# Patient Record
Sex: Female | Born: 1960 | Race: Black or African American | Hispanic: No | Marital: Married | Smoking: Current every day smoker
Health system: Southern US, Community
[De-identification: ages and names within clinical notes are randomized; demographics above are authoritative.]

## PROBLEM LIST (undated history)

## (undated) DIAGNOSIS — D219 Benign neoplasm of connective and other soft tissue, unspecified: Secondary | ICD-10-CM

## (undated) DIAGNOSIS — K5792 Diverticulitis of intestine, part unspecified, without perforation or abscess without bleeding: Secondary | ICD-10-CM

## (undated) DIAGNOSIS — M21371 Foot drop, right foot: Secondary | ICD-10-CM

## (undated) DIAGNOSIS — E785 Hyperlipidemia, unspecified: Secondary | ICD-10-CM

## (undated) DIAGNOSIS — B373 Candidiasis of vulva and vagina: Secondary | ICD-10-CM

## (undated) DIAGNOSIS — K8020 Calculus of gallbladder without cholecystitis without obstruction: Secondary | ICD-10-CM

## (undated) DIAGNOSIS — I10 Essential (primary) hypertension: Secondary | ICD-10-CM

## (undated) DIAGNOSIS — R011 Cardiac murmur, unspecified: Secondary | ICD-10-CM

## (undated) DIAGNOSIS — I700 Atherosclerosis of aorta: Secondary | ICD-10-CM

## (undated) DIAGNOSIS — A749 Chlamydial infection, unspecified: Secondary | ICD-10-CM

## (undated) DIAGNOSIS — E278 Other specified disorders of adrenal gland: Secondary | ICD-10-CM

## (undated) DIAGNOSIS — B3731 Acute candidiasis of vulva and vagina: Secondary | ICD-10-CM

## (undated) HISTORY — PX: TUBAL LIGATION: SHX77

## (undated) HISTORY — DX: Hyperlipidemia, unspecified: E78.5

## (undated) HISTORY — DX: Chlamydial infection, unspecified: A74.9

## (undated) HISTORY — DX: Essential (primary) hypertension: I10

## (undated) HISTORY — DX: Acute candidiasis of vulva and vagina: B37.31

## (undated) HISTORY — DX: Other specified disorders of adrenal gland: E27.8

## (undated) HISTORY — DX: Benign neoplasm of connective and other soft tissue, unspecified: D21.9

## (undated) HISTORY — DX: Foot drop, right foot: M21.371

## (undated) HISTORY — DX: Cardiac murmur, unspecified: R01.1

## (undated) HISTORY — DX: Atherosclerosis of aorta: I70.0

## (undated) HISTORY — DX: Calculus of gallbladder without cholecystitis without obstruction: K80.20

## (undated) HISTORY — DX: Candidiasis of vulva and vagina: B37.3

---

## 2002-10-31 ENCOUNTER — Encounter: Admission: RE | Admit: 2002-10-31 | Discharge: 2003-01-29 | Payer: Self-pay | Admitting: Family Medicine

## 2003-02-12 ENCOUNTER — Other Ambulatory Visit: Admission: RE | Admit: 2003-02-12 | Discharge: 2003-02-12 | Payer: Self-pay | Admitting: Obstetrics and Gynecology

## 2004-02-20 ENCOUNTER — Other Ambulatory Visit: Admission: RE | Admit: 2004-02-20 | Discharge: 2004-02-20 | Payer: Self-pay | Admitting: *Deleted

## 2004-11-27 ENCOUNTER — Ambulatory Visit (HOSPITAL_COMMUNITY): Admission: RE | Admit: 2004-11-27 | Discharge: 2004-11-27 | Payer: Self-pay | Admitting: Obstetrics and Gynecology

## 2005-05-07 ENCOUNTER — Encounter: Admission: RE | Admit: 2005-05-07 | Discharge: 2005-05-07 | Payer: Self-pay | Admitting: Family Medicine

## 2006-08-11 ENCOUNTER — Other Ambulatory Visit: Admission: RE | Admit: 2006-08-11 | Discharge: 2006-08-11 | Payer: Self-pay | Admitting: Family Medicine

## 2006-08-23 ENCOUNTER — Encounter: Admission: RE | Admit: 2006-08-23 | Discharge: 2006-08-23 | Payer: Self-pay | Admitting: Family Medicine

## 2007-11-07 ENCOUNTER — Other Ambulatory Visit: Admission: RE | Admit: 2007-11-07 | Discharge: 2007-11-07 | Payer: Self-pay | Admitting: Family Medicine

## 2009-01-31 ENCOUNTER — Other Ambulatory Visit: Admission: RE | Admit: 2009-01-31 | Discharge: 2009-01-31 | Payer: Self-pay | Admitting: Family Medicine

## 2009-10-21 ENCOUNTER — Encounter: Admission: RE | Admit: 2009-10-21 | Discharge: 2009-10-21 | Payer: Self-pay | Admitting: Family Medicine

## 2009-10-28 ENCOUNTER — Encounter: Admission: RE | Admit: 2009-10-28 | Discharge: 2009-10-28 | Payer: Self-pay | Admitting: Family Medicine

## 2010-02-17 ENCOUNTER — Other Ambulatory Visit: Admission: RE | Admit: 2010-02-17 | Discharge: 2010-02-17 | Payer: Self-pay | Admitting: Family Medicine

## 2011-07-28 ENCOUNTER — Other Ambulatory Visit (HOSPITAL_COMMUNITY)
Admission: RE | Admit: 2011-07-28 | Discharge: 2011-07-28 | Disposition: A | Discharge status: Home / Self Care | Payer: 59 | Source: Ambulatory Visit | Attending: Family Medicine | Admitting: Family Medicine

## 2011-07-28 ENCOUNTER — Other Ambulatory Visit: Payer: Self-pay | Admitting: Physician Assistant

## 2011-07-28 DIAGNOSIS — Z124 Encounter for screening for malignant neoplasm of cervix: Secondary | ICD-10-CM | POA: Insufficient documentation

## 2012-01-17 ENCOUNTER — Ambulatory Visit (INDEPENDENT_AMBULATORY_CARE_PROVIDER_SITE_OTHER): Payer: 59 | Admitting: Obstetrics and Gynecology

## 2012-01-17 ENCOUNTER — Encounter: Payer: Self-pay | Admitting: Obstetrics and Gynecology

## 2012-01-17 VITALS — BP 130/82 | Resp 16 | Ht 67.0 in | Wt 203.0 lb

## 2012-01-17 DIAGNOSIS — I10 Essential (primary) hypertension: Secondary | ICD-10-CM | POA: Insufficient documentation

## 2012-01-17 DIAGNOSIS — N920 Excessive and frequent menstruation with regular cycle: Secondary | ICD-10-CM | POA: Insufficient documentation

## 2012-01-17 DIAGNOSIS — F17200 Nicotine dependence, unspecified, uncomplicated: Secondary | ICD-10-CM

## 2012-01-17 DIAGNOSIS — E139 Other specified diabetes mellitus without complications: Secondary | ICD-10-CM | POA: Insufficient documentation

## 2012-01-17 DIAGNOSIS — D259 Leiomyoma of uterus, unspecified: Secondary | ICD-10-CM | POA: Insufficient documentation

## 2012-01-17 DIAGNOSIS — E119 Type 2 diabetes mellitus without complications: Secondary | ICD-10-CM

## 2012-01-17 DIAGNOSIS — E7800 Pure hypercholesterolemia, unspecified: Secondary | ICD-10-CM

## 2012-01-17 DIAGNOSIS — E669 Obesity, unspecified: Secondary | ICD-10-CM

## 2012-01-17 DIAGNOSIS — F17210 Nicotine dependence, cigarettes, uncomplicated: Secondary | ICD-10-CM

## 2012-01-17 NOTE — Progress Notes (Signed)
Regular Periods: no Mammogram: yes  Monthly Breast Ex.: yes Exercise: yes  Tetanus < 10 years: no Seatbelts: yes  NI. Bladder Functn.: yes Abuse at home: no  Daily BM's: yes Stressful Work: yes  Healthy Diet: yes Sigmoid-Colonoscopy: NO  Calcium: no Medical problems this year:PT C/O CYCLE PROBLEM   LAST PAP2012 IN JUNE  Contraception: BTL Mammogram: 2011 NL ZOX:WRUEAV REDMON PAC PMH: NO CHANGE FMH: NO CHANGE Last Bone Scan:NEVER

## 2012-01-17 NOTE — Progress Notes (Addendum)
This is the first visit to our office in greater than 3 years.  She complains of heavy menstrual bleeding.  Her last Depo-Provera shot was in January of 2013.  She has had a tubal ligation in the past.  She denies a past history of sexual transmitted infections.  Obstetrical history:  The patient has had 2 full-term vaginal deliveries.  Drug allergies:  None  Past medical history:  The patient has a history of hypertension, diabetes, hypercholesterolemia, and obesity.  Please see her listed medication history.  Social history:  The patient smokes one pack of cigarettes a day for the past 20 years.  Review of systems:  See history of present illness  Family history:  Noncontributory  Physical exam:  Weight: 203 pounds  Height: 5 feet 7 inches  H EENT: Within normal limits  Chest: Clear  Heart: Regular rate and rhythm  Abdomen: Nontender, no masses, no adenopathy  Pelvic exam:  External genitalia: Normal  Vagina: Relaxation noted  Cervix: Nontender  Uterus: 8 week size and irregular, nontender  Adnexa: No masses  Assessment:  Menorrhagia Hypertension Diabetes Obesity Hyperlipidemia Cigarette smoker  Plan:  A long discussion was held about the surgical and medical management of menorrhagia.  This can benefits of both options were outlined.  I recommend that she consider Mirena IUD or endometrial ablation.  The patient was given information to read.  Hydro-sonogram and endometrial biopsy next visit.  Return to office in 2 weeks.  Mylinda Latina.D.

## 2012-02-01 ENCOUNTER — Other Ambulatory Visit: Payer: Self-pay | Admitting: Obstetrics and Gynecology

## 2012-02-01 ENCOUNTER — Encounter: Payer: Self-pay | Admitting: Obstetrics and Gynecology

## 2012-02-01 ENCOUNTER — Ambulatory Visit (INDEPENDENT_AMBULATORY_CARE_PROVIDER_SITE_OTHER): Payer: 59

## 2012-02-01 ENCOUNTER — Ambulatory Visit (INDEPENDENT_AMBULATORY_CARE_PROVIDER_SITE_OTHER): Payer: 59 | Admitting: Obstetrics and Gynecology

## 2012-02-01 VITALS — BP 132/78 | Resp 16 | Ht 67.0 in | Wt 204.0 lb

## 2012-02-01 DIAGNOSIS — E669 Obesity, unspecified: Secondary | ICD-10-CM

## 2012-02-01 DIAGNOSIS — N920 Excessive and frequent menstruation with regular cycle: Secondary | ICD-10-CM

## 2012-02-01 LAB — POCT URINE PREGNANCY: Preg Test, Ur: NEGATIVE

## 2012-02-01 NOTE — Progress Notes (Signed)
Addended by: Janeece Agee on: 02/01/2012 05:59 PM   Modules accepted: Orders

## 2012-02-01 NOTE — Progress Notes (Signed)
Addended by: Tim Lair on: 02/01/2012 12:13 PM   Modules accepted: Orders

## 2012-02-01 NOTE — Progress Notes (Addendum)
[name] is a 51 y.o. year old female,G2P2002, who presents for a problem visit. The patient has a known history of fibroids.  She complains of increasing menorrhagia.  She has diabetes, hypertension, and hypercholesterolemia.  She is obese.  Subjective:  Doing okay.  Objective:  LMP 12/18/2011   General: alert, cooperative and no distress GI: soft, non-tender; bowel sounds normal; no masses,  no organomegaly  External genitalia: normal general appearance Vaginal: normal without tenderness, induration or masses and relaxation noted Cervix: normal appearance Adnexa: normal bimanual exam Uterus: upper limits normal size  Urine pregnancy test: Negative  Procedure:  The vagina was prepped with multiple layers of Betadine.  Hurricaine gel applied to the cervix.  Sounds to 8 cm.  Endometrial biopsy obtained.  Tolerated well.  Catheter inserted.  30 cc of sterile saline injected.  A 3-D ultrasound. No lesions seen in the endometrial cavity.  1.8 cm and 1.6 cm fibroids noted in the subserosal area.  Tolerated well.  Assessment:  Menorrhagia Fibroids  Plan:  Endometrial biopsy to pathology  Management options reviewed.  Risk and benefits discussed.  The patient and her husband elect to proceed with endometrial ablation.  We will schedule in the office.  Return to office in 2-4 week(s).   Leonard Schwartz M.D.  02/01/2012 11:42 AM

## 2012-02-02 NOTE — Progress Notes (Signed)
Addended by: Tim Lair on: 02/02/2012 09:42 AM   Modules accepted: Orders

## 2012-02-04 LAB — PATHOLOGY

## 2012-02-08 ENCOUNTER — Telehealth: Payer: Self-pay | Admitting: Obstetrics and Gynecology

## 2012-02-10 MED ORDER — LORAZEPAM 0.5 MG PO TABS: 0.5000 mg | ORAL_TABLET | ORAL | Status: AC

## 2012-02-10 MED ORDER — HYDROCODONE-ACETAMINOPHEN 7.5-750 MG PO TABS: 1.0000 | ORAL_TABLET | ORAL | Status: AC

## 2012-02-10 NOTE — Progress Notes (Signed)
Addended by: Janine Limbo on: 02/10/2012 12:56 PM   Modules accepted: Orders

## 2012-02-14 ENCOUNTER — Telehealth: Payer: Self-pay

## 2012-02-14 NOTE — Telephone Encounter (Signed)
Note was left on pt chart to confirm if Rx had went through for Ativan 0.5mg  and Vicodin ES 7.5-750mg . Rx had not been called in but were actually printed on 02-10-12. Two Rx were called in today per Vinay @ [facility] on [address]. [facility] CMA

## 2012-02-16 ENCOUNTER — Telehealth: Payer: Self-pay | Admitting: Obstetrics and Gynecology

## 2012-02-16 NOTE — Telephone Encounter (Signed)
In Office Novasure scheduled for 02/24/12 @ 11: 00 with AVS.  [employer] effective 09/13/97.  Plan pays 100% after a $35 copayment Per Shanda Bumps REF# [id number] -Adrianne Pridgen

## 2012-02-24 ENCOUNTER — Ambulatory Visit (INDEPENDENT_AMBULATORY_CARE_PROVIDER_SITE_OTHER): Payer: 59 | Admitting: Obstetrics and Gynecology

## 2012-02-24 ENCOUNTER — Encounter: Payer: Self-pay | Admitting: Obstetrics and Gynecology

## 2012-02-24 VITALS — BP 126/78 | HR 84 | Temp 98.3°F | Resp 16 | Wt 204.0 lb

## 2012-02-24 DIAGNOSIS — N920 Excessive and frequent menstruation with regular cycle: Secondary | ICD-10-CM

## 2012-02-24 LAB — POCT URINE PREGNANCY: Preg Test, Ur: NEGATIVE

## 2012-02-24 MED ORDER — DOXYCYCLINE MONOHYDRATE 100 MG PO CAPS: 100.0000 mg | ORAL_CAPSULE | Freq: Two times a day (BID) | ORAL | Status: AC

## 2012-02-24 MED ORDER — KETOROLAC TROMETHAMINE 60 MG/2ML IM SOLN
60.0000 mg | Freq: Once | INTRAMUSCULAR | Status: AC
  Administered 2012-02-24: 60 mg via INTRAMUSCULAR

## 2012-02-24 NOTE — Progress Notes (Addendum)
HISTORY OF PRESENT ILLNESS  [name] is a 51 y.o. year old female,G2P2002, who presents for a problem visit. She has menorrhagia and was to proceed with endometrial ablation.  Her endometrial biopsy returned benign.  Subjective:  Doing well.  Sleepy from her medications.  Objective:  BP 126/78  Pulse 84  Temp 98.3 F (36.8 C)  Resp 16  Wt 204 lb (92.534 kg)  LMP 01/21/2012   General: sleepy from medications Resp: clear to auscultation bilaterally Cardio: regular rate and rhythm, S1, S2 normal, no murmur, click, rub or gallop GI: soft, non-tender; bowel sounds normal; no masses,  no organomegaly  External genitalia: normal general appearance Vaginal: normal without tenderness, induration or masses and relaxation noted Cervix: normal appearance Adnexa: normal bimanual exam Uterus: upper limits normal size  Procedure:  Hurricaine gel was placed on the cervix.  The cervix was prepped with multiple layers of Betadine.  A paracervical block was placed using 10 cc of half percent Marcaine and 1% lidocaine.  Another 10 cc of half percent Marcaine and 1% lidocaine were injected directly into the cervix. 10 min. Was allowed for the anesthetic to work.  The cervix sounded to 2.5 cm.  The uterus sounded to 9 cm.  The cavity length was noted to be 6.5 cm.  The NovaSure apparatus was tested.  It appeared to work properly.  The NovaSure instrument was placed inside the uterus.  The cavity width was 3 cm.  A test was performed and the instrument was noted to work properly.  The ablation cycle was activated.  The cavity was ablated for 145 seconds.  The patient tolerated her procedure well.  All instrument were removed.  The uterus was reexamined and was noted to be firm.  The patient was returned to the supine position.  She was allowed to rest.  She tolerated her procedure well.  Assessment:  menorrhagia  Plan:  Instructions were given for patients who've undergone NovaSure and  endometrial ablation.  She will call for fevers or chills.  She will call for severe pain.  She will return in 3 weeks for examination.  She can use ibuprofen or Vicodin for pain. She will take doxycycline 100 mg twice each day for 7 days.  Return to office in 3 week(s).   Leonard Schwartz M.D.  02/24/2012 12:09 PM

## 2012-02-24 NOTE — Addendum Note (Signed)
Addended by: Janine Limbo on: 02/24/2012 01:51 PM   Modules accepted: Orders

## 2012-02-24 NOTE — Patient Instructions (Signed)
Endometrial Ablation Endometrial ablation removes the lining of the uterus (endometrium). It is usually a same day, outpatient treatment. Ablation helps avoid major surgery (such as a hysterectomy). A hysterectomy is removal of the cervix and uterus. Endometrial ablation has less risk and complications, has a shorter recovery period and is less expensive. After endometrial ablation, most women will have little or no menstrual bleeding. You may not keep your fertility. Pregnancy is no longer likely after this procedure but if you are pre-menopausal, you still need to use a reliable method of birth control following the procedure because pregnancy can occur. REASONS TO HAVE THE PROCEDURE MAY INCLUDE:  Heavy periods.   Bleeding that is causing anemia.   Anovulatory bleeding, very irregular, bleeding.   Bleeding submucous fibroids (on the lining inside the uterus) if they are smaller than 3 centimeters.  REASONS NOT TO HAVE THE PROCEDURE MAY INCLUDE:  You wish to have more children.   You have a pre-cancerous or cancerous problem. The cause of any abnormal bleeding must be diagnosed before having the procedure.   You have pain coming from the uterus.   You have a submucus fibroid larger than 3 centimeters.   You recently had a baby.   You recently had an infection in the uterus.   You have a severe retro-flexed, tipped uterus and cannot insert the instrument to do the ablation.   You had a Cesarean section or deep major surgery on the uterus.   The inner cavity of the uterus is too large for the endometrial ablation instrument.  RISKS AND COMPLICATIONS   Perforation of the uterus.   Bleeding.   Infection of the uterus, bladder or vagina.   Injury to surrounding organs.   Cutting the cervix.   An air bubble to the lung (air embolus).   Pregnancy following the procedure.   Failure of the procedure to help the problem requiring hysterectomy.   Decreased ability to diagnose  cancer in the lining of the uterus.  BEFORE THE PROCEDURE  The lining of the uterus must be tested to make sure there is no pre-cancerous or cancer cells present.   Medications may be given to make the lining of the uterus thinner.   Ultrasound may be used to evaluate the size and look for abnormalities of the uterus.   Future pregnancy is not desired.  PROCEDURE  There are different ways to destroy the lining of the uterus.   Resectoscope - radio frequency-alternating electric current is the most common one used.   Cryotherapy - freezing the lining of the uterus.   Heated Free Liquid - heated salt (saline) solution inserted into the uterus.   Microwave - uses high energy microwaves in the uterus.   Thermal Balloon - a catheter with a balloon tip is inserted into the uterus and filled with heated fluid.  Your caregiver will talk with you about the method used in this clinic. They will also instruct you on the pros and cons of the procedure. Endometrial ablation is performed along with a procedure called operative hysteroscopy. A narrow viewing tube is inserted through the birth canal (vagina) and through the cervix into the uterus. A tiny camera attached to the viewing tube (hysteroscope) allows the uterine cavity to be shown on a TV monitor during surgery. Your uterus is filled with a harmless liquid to make the procedure easier. The lining of the uterus is then removed. The lining can also be removed with a resectoscope which allows your surgeon   to cut away the lining of the uterus under direct vision. Usually, you will be able to go home within an hour after the procedure. HOME CARE INSTRUCTIONS   Do not drive for 24 hours.   No tampons, douching or intercourse for 2 weeks or until your caregiver approves.   Rest at home for 24 to 48 hours. You may then resume normal activities unless told differently by your caregiver.   Take your temperature two times a day for 4 days, and record  it.   Take any medications your caregiver has ordered, as directed.   Use some form of contraception if you are pre-menopausal and do not want to get pregnant.  Bleeding after the procedure is normal. It varies from light spotting and mildly watery to bloody discharge for 4 to 6 weeks. You may also have mild cramping. Only take over-the-counter or prescription medicines for pain, discomfort, or fever as directed by your caregiver. Do not use aspirin, as this may aggravate bleeding. Frequent urination during the first 24 hours is normal. You will not know how effective your surgery is until at least 3 months after the surgery. SEEK IMMEDIATE MEDICAL CARE IF:   Bleeding is heavier than a normal menstrual cycle.   An oral temperature above 102 F (38.9 C) develops.   You have increasing cramps or pains not relieved with medication or develop belly (abdominal) pain which does not seem to be related to the same area of earlier cramping and pain.   You are light headed, weak or have fainting episodes.   You develop pain in the shoulder strap areas.   You have chest or leg pain.   You have abnormal vaginal discharge.   You have painful urination.  Document Released: 07/09/2004 Document Revised: 08/19/2011 Document Reviewed: 10/07/2007 [employer] Patient Information 2012 [employer]. 

## 2012-03-09 ENCOUNTER — Encounter: Payer: Self-pay | Admitting: Obstetrics and Gynecology

## 2012-03-09 ENCOUNTER — Ambulatory Visit (INDEPENDENT_AMBULATORY_CARE_PROVIDER_SITE_OTHER): Payer: 59 | Admitting: Obstetrics and Gynecology

## 2012-03-09 VITALS — BP 126/80 | Temp 98.5°F | Ht 67.0 in | Wt 203.0 lb

## 2012-03-09 DIAGNOSIS — N920 Excessive and frequent menstruation with regular cycle: Secondary | ICD-10-CM

## 2012-03-09 NOTE — Progress Notes (Signed)
DATE OF SURGERY: 02/24/2012 TYPE OF SURGERY:Novasure Ablation PAIN:No VAG BLEEDING: no/ spotting only. VAG DISCHARGE: no NORMAL GI FUNCTN: yes NORMAL GU FUNCTN: yes  HISTORY OF PRESENT ILLNESS  Nicole Houston is a 51 y.o. year old female,G2P2002, who presents for a problem visit. The patient had a NovaSure ablation of the endometrium on February 24, 2012.  She tolerated her procedure well.  She has a past history of menorrhagia.  Subjective:  The patient reports that she has only a slight discharge at this point.  Objective:  BP 126/80  Temp 98.5 F (36.9 C)  Ht 5\' 7"  (1.702 m)  Wt 203 lb (92.08 kg)  BMI 31.79 kg/m2  LMP 03/08/2012   GI: soft, non-tender; bowel sounds normal; no masses,  no organomegaly  External genitalia: normal general appearance Vaginal: normal without tenderness, induration or masses and relaxation noted Cervix: normal appearance and slight discharge from the cervix Adnexa: normal bimanual exam Uterus: upper limits normal size  Assessment:  Point well status post endometrial ablation for menorrhagia  Plan:  Return to normal activities.  Return to office prn if symptoms worsen or fail to improve.   Leonard Schwartz M.D.  03/09/2012 7:39 PM

## 2014-03-27 ENCOUNTER — Other Ambulatory Visit: Payer: Self-pay | Admitting: Family Medicine

## 2014-03-27 DIAGNOSIS — R1032 Left lower quadrant pain: Secondary | ICD-10-CM

## 2014-03-28 ENCOUNTER — Other Ambulatory Visit: Payer: Self-pay

## 2014-03-28 DIAGNOSIS — Z1231 Encounter for screening mammogram for malignant neoplasm of breast: Secondary | ICD-10-CM

## 2014-04-04 ENCOUNTER — Ambulatory Visit
Admission: RE | Admit: 2014-04-04 | Discharge: 2014-04-04 | Disposition: A | Discharge status: Home / Self Care | Payer: 59 | Source: Ambulatory Visit | Attending: Family Medicine | Admitting: Family Medicine

## 2014-04-04 ENCOUNTER — Ambulatory Visit
Admission: RE | Admit: 2014-04-04 | Discharge: 2014-04-04 | Disposition: A | Discharge status: Home / Self Care | Payer: 59 | Source: Ambulatory Visit

## 2014-04-04 ENCOUNTER — Encounter (INDEPENDENT_AMBULATORY_CARE_PROVIDER_SITE_OTHER): Payer: Self-pay

## 2014-04-04 DIAGNOSIS — R1032 Left lower quadrant pain: Secondary | ICD-10-CM

## 2014-04-04 DIAGNOSIS — Z1231 Encounter for screening mammogram for malignant neoplasm of breast: Secondary | ICD-10-CM

## 2014-04-08 ENCOUNTER — Encounter (INDEPENDENT_AMBULATORY_CARE_PROVIDER_SITE_OTHER): Payer: Self-pay | Admitting: General Surgery

## 2014-04-08 ENCOUNTER — Other Ambulatory Visit: Payer: Self-pay | Admitting: Family Medicine

## 2014-04-08 DIAGNOSIS — R928 Other abnormal and inconclusive findings on diagnostic imaging of breast: Secondary | ICD-10-CM

## 2014-04-11 ENCOUNTER — Ambulatory Visit (INDEPENDENT_AMBULATORY_CARE_PROVIDER_SITE_OTHER): Payer: 59 | Admitting: General Surgery

## 2014-04-15 ENCOUNTER — Ambulatory Visit
Admission: RE | Admit: 2014-04-15 | Discharge: 2014-04-15 | Disposition: A | Discharge status: Home / Self Care | Payer: 59 | Source: Ambulatory Visit | Attending: Family Medicine | Admitting: Family Medicine

## 2014-04-15 DIAGNOSIS — R928 Other abnormal and inconclusive findings on diagnostic imaging of breast: Secondary | ICD-10-CM

## 2014-05-01 ENCOUNTER — Encounter (INDEPENDENT_AMBULATORY_CARE_PROVIDER_SITE_OTHER): Payer: Self-pay | Admitting: General Surgery

## 2014-05-01 ENCOUNTER — Ambulatory Visit (INDEPENDENT_AMBULATORY_CARE_PROVIDER_SITE_OTHER): Payer: 59 | Admitting: General Surgery

## 2014-05-01 VITALS — BP 122/80 | HR 77 | Ht 67.0 in | Wt 200.0 lb

## 2014-05-01 DIAGNOSIS — K8020 Calculus of gallbladder without cholecystitis without obstruction: Secondary | ICD-10-CM

## 2014-05-01 DIAGNOSIS — K838 Other specified diseases of biliary tract: Secondary | ICD-10-CM | POA: Insufficient documentation

## 2014-05-01 NOTE — Patient Instructions (Signed)
I do not believe your left lower quadrant discomfort and pain is due to gallstone disease. I believe it may be more consistent with diverticular problems. You also have a dilated bile duct on your ultrasound. You need to followup with your gastroenterologist to discuss these 2 issues and for additional workup. Please call with any questions  Diverticulosis Diverticulosis is the condition that develops when small pouches (diverticula) form in the wall of your colon. Your colon, or large intestine, is where water is absorbed and stool is formed. The pouches form when the inside layer of your colon pushes through weak spots in the outer layers of your colon. CAUSES  No one knows exactly what causes diverticulosis. RISK FACTORS  Being older than 42. Your risk for this condition increases with age. Diverticulosis is rare in people younger than 40 years. By age 5, almost everyone has it.  Eating a low-fiber diet.  Being frequently constipated.  Being overweight.  Not getting enough exercise.  Smoking.  Taking over-the-counter pain medicines, like aspirin and ibuprofen. SYMPTOMS  Most people with diverticulosis do not have symptoms. DIAGNOSIS  Because diverticulosis often has no symptoms, health care providers often discover the condition during an exam for other colon problems. In many cases, a health care provider will diagnose diverticulosis while using a flexible scope to examine the colon (colonoscopy). TREATMENT  If you have never developed an infection related to diverticulosis, you may not need treatment. If you have had an infection before, treatment may include:  Eating more fruits, vegetables, and grains.  Taking a fiber supplement.  Taking a live bacteria supplement (probiotic).  Taking medicine to relax your colon. HOME CARE INSTRUCTIONS   Drink at least 6-8 glasses of water each day to prevent constipation.  Try not to strain when you have a bowel movement.  Keep all  follow-up appointments. If you have had an infection before:  Increase the fiber in your diet as directed by your health care provider or dietitian.  Take a dietary fiber supplement if your health care provider approves.  Only take medicines as directed by your health care provider. SEEK MEDICAL CARE IF:   You have abdominal pain.  You have bloating.  You have cramps.  You have not gone to the bathroom in 3 days. SEEK IMMEDIATE MEDICAL CARE IF:   Your pain gets worse.  Yourbloating becomes very bad.  You have a fever or chills, and your symptoms suddenly get worse.  You begin vomiting.  You have bowel movements that are bloody or black. MAKE SURE YOU:  Understand these instructions.  Will watch your condition.  Will get help right away if you are not doing well or get worse. Document Released: 05/27/2004 Document Revised: 09/04/2013 Document Reviewed: 07/25/2013 [facility] Patient Information 2015 [place], [place]. This information is not intended to replace advice given to you by your health care provider. Make sure you discuss any questions you have with your health care provider.

## 2014-05-01 NOTE — Progress Notes (Signed)
Patient ID: Nicole Houston, female   DOB: 1961-08-29, 53 y.o.   MRN: [id number]  Chief Complaint  Patient presents with  . eval gallstones    HPI Nicole Houston is a 53 y.o. female.  HPI 53 yo AAF referred by Dr Maurice Small for evaluation of cholelithiasis. Patient states about a month and a half ago she developed stomach pain that was initially constant. It lasted for several days to a week or so. She states that the pain was in her left lower abdomen. She describes the pain as a pressure sensation. While it was initially constant it then became less frequent. She has not had an episode in the past 2-1/2 weeks. She denies any nausea, vomiting, fever or chills. She states that she did feel a little bit bloated when she had the pressure sensation. She rates the discomfort as a 4/10. She reports daily bowel movements. She denies any stool caliber changes. She denies any melena or hematochezia. She denies any reflux. Nothing made the discomfort worse or better. She states that she saw her primary care physician who ordered an abdominal ultrasound and check some blood work. I do not have her lab work for the office visit note. She has never had a colonoscopy. She was scheduled to have one about this time but canceled it when she was told she had a gallstone. Past Medical History  Diagnosis Date  . Diabetes mellitus   . Hypertension   . Hyperlipemia   . Measles   . Yeast vaginitis   . Chlamydia   . Fibroid   . Murmur     Past Surgical History  Procedure Laterality Date  . Tubal ligation      Family History  Problem Relation Age of Onset  . Diabetes Father   . Hypertension Father   . Diabetes Mother   . Hypertension Mother     Social History History  Substance Use Topics  . Smoking status: Current Every Day Smoker -- 1.00 packs/day    Types: Cigarettes  . Smokeless tobacco: Current User     Comment: pt states she smoke a packa day  . Alcohol Use: No    Allergies  Allergen  Reactions  . Altace [Ramipril]   . Tenormin [Atenolol]     Feel bad    Current Outpatient Prescriptions  Medication Sig Dispense Refill  . amLODipine (NORVASC) 10 MG tablet       . glimepiride (AMARYL) 2 MG tablet       . losartan-hydrochlorothiazide (HYZAAR) 100-25 MG per tablet       . metFORMIN (GLUCOPHAGE) 1000 MG tablet        No current facility-administered medications for this visit.    Review of Systems Review of Systems  Constitutional: Negative for fever, activity change, appetite change and unexpected weight change.  HENT: Negative for nosebleeds and trouble swallowing.   Eyes: Negative for photophobia and visual disturbance.  Respiratory: Negative for chest tightness and shortness of breath.   Cardiovascular: Negative for chest pain and leg swelling.       Denies CP, SOB, orthopnea, PND, DOE  Gastrointestinal:       See hpi  Genitourinary: Negative for dysuria and difficulty urinating.  Musculoskeletal: Negative for arthralgias.  Skin: Negative for pallor and rash.  Neurological: Negative for dizziness, seizures, facial asymmetry and numbness.          Hematological: Negative for adenopathy. Does not bruise/bleed easily.  Psychiatric/Behavioral: Negative for behavioral problems and agitation.  Blood pressure 122/80, pulse 77, height 5\' 7"  (1.702 m), weight 200 lb (90.719 kg).  Physical Exam Physical Exam  Constitutional: She is oriented to person, place, and time. She appears well-developed and well-nourished. No distress.  HENT:  Head: Normocephalic and atraumatic.  Right Ear: External ear normal.  Left Ear: External ear normal.  Eyes: Conjunctivae are normal. No scleral icterus.  Neck: Normal range of motion. Neck supple. No tracheal deviation present. No thyromegaly present.  Cardiovascular: Normal rate, normal heart sounds and intact distal pulses.   Pulmonary/Chest: Effort normal and breath sounds normal. No respiratory distress. She has no wheezes.   Abdominal: Soft. She exhibits no distension. There is no tenderness. There is no rebound and no guarding.  Musculoskeletal: Normal range of motion. She exhibits no edema and no tenderness.  Lymphadenopathy:    She has no cervical adenopathy.  Neurological: She is alert and oriented to person, place, and time. She exhibits normal muscle tone.  Skin: Skin is warm and dry. No rash noted. She is not diaphoretic. No erythema. No pallor.  Psychiatric: She has a normal mood and affect. Her behavior is normal. Judgment and thought content normal.    Data Reviewed abd u/s  Assessment    Cholelithiasis Dilated common bile duct LLQ Abdominal pain     Plan    Her symptoms are not consistent with symptomatic gallbladder disease. She does have a gallstone I do not believe it is confusing to her left lower quadrant pain. Fortunately her pain has essentially resolved. My best guess is this might have been a diverticular episode. I do think she needs a screening colonoscopy. I have advised the patient to contact her PCPs office to get rescheduled for the gastroenterologist consult.  I do not have a copy of her lab work. The patient states she was told her liver function tests and pancreas numbers were normal. It's unclear as to what to make of the isolated dilated common bile duct. I do think this needs to be further evaluated by the gastroenterologist. I will defer to them about additional imaging.    We discussed diverticular disease and the patient was given educational material. All of her questions were asked and answered. Followup as needed. We did discuss symptoms attributable to gallbladder disease and what to look out for  Leighton Ruff. Redmond Pulling, MD, FACS General, Bariatric, & Minimally Invasive Surgery [facility]  Note: This dictation was prepared with Dragon/digital dictation along with [facility] technology. Any transcriptional errors that result from this process are  unintentional.        Gayland Curry 05/01/2014, 10:23 AM

## 2014-07-15 ENCOUNTER — Encounter (INDEPENDENT_AMBULATORY_CARE_PROVIDER_SITE_OTHER): Payer: Self-pay | Admitting: General Surgery

## 2014-07-18 ENCOUNTER — Ambulatory Visit
Admission: RE | Admit: 2014-07-18 | Discharge: 2014-07-18 | Disposition: A | Discharge status: Home / Self Care | Payer: 59 | Source: Ambulatory Visit | Attending: Family Medicine | Admitting: Family Medicine

## 2014-07-18 ENCOUNTER — Other Ambulatory Visit: Payer: Self-pay | Admitting: Family Medicine

## 2014-07-18 DIAGNOSIS — R1032 Left lower quadrant pain: Secondary | ICD-10-CM

## 2014-07-18 MED ORDER — IOHEXOL 300 MG/ML  SOLN
100.0000 mL | Freq: Once | INTRAMUSCULAR | Status: AC | PRN
  Administered 2014-07-18: 100 mL via INTRAVENOUS

## 2014-08-13 ENCOUNTER — Other Ambulatory Visit: Payer: Self-pay | Admitting: Gastroenterology

## 2015-05-24 ENCOUNTER — Ambulatory Visit (INDEPENDENT_AMBULATORY_CARE_PROVIDER_SITE_OTHER): Payer: 59 | Admitting: Family Medicine

## 2015-05-24 VITALS — BP 114/72 | HR 59 | Temp 98.1°F | Resp 14 | Ht 67.5 in | Wt 202.4 lb

## 2015-05-24 DIAGNOSIS — K5732 Diverticulitis of large intestine without perforation or abscess without bleeding: Secondary | ICD-10-CM | POA: Diagnosis not present

## 2015-05-24 MED ORDER — AMOXICILLIN-POT CLAVULANATE 875-125 MG PO TABS
1.0000 | ORAL_TABLET | Freq: Two times a day (BID) | ORAL | Status: DC
Start: 2015-05-24 — End: 2017-06-14

## 2015-05-24 NOTE — Patient Instructions (Signed)

## 2015-05-24 NOTE — Progress Notes (Signed)
@[facility] @  This chart was scribed for Robyn Haber, MD by Thea Alken, ED Scribe. This patient was seen in room 1 and the patient's care was started at 10:15 AM.  Patient ID: Nicole Houston MRN: [id number], DOB: April 22, 1961, 54 y.o. Date of Encounter: 05/24/2015, 10:14 AM  Primary Physician: Jonathon Bellows, MD  Chief Complaint:  Chief Complaint  Patient presents with  . Abdominal Pain    History of Diverticulitis    HPI: 54 y.o. year old female with history diverticulitis presents with abdominal pain for 2 weeks. Pt reports abdominal pain worsened this morning with nausea and groin pain. She denies pain radiating to bladder or to LE. She had colonoscopy in 08/2014. Her last flare up of diverticulitis was October 2015 and also had some pain in April but was not seen. She has had CT scan and MRI in the past prior to colonoscopy. No fever, chills and nausea. She denies drug allergies. Pt smokes a pack a day.   Pt works at [employer] in [employer].  Past Medical History  Diagnosis Date  . Diabetes mellitus   . Hypertension   . Hyperlipemia   . Measles   . Yeast vaginitis   . Chlamydia   . Fibroid   . Murmur      Home Meds: Prior to Admission medications   Medication Sig Start Date End Date Taking? Authorizing Provider  amLODipine (NORVASC) 10 MG tablet  03/27/14  Yes Historical Provider, MD  glimepiride (AMARYL) 2 MG tablet  03/27/14  Yes Historical Provider, MD  losartan-hydrochlorothiazide (HYZAAR) 100-25 MG per tablet  03/27/14  Yes Historical Provider, MD  metFORMIN (GLUCOPHAGE) 1000 MG tablet  04/22/14  Yes Historical Provider, MD    Allergies:  Allergies  Allergen Reactions  . Altace [Ramipril]   . Tenormin [Atenolol]     Feel bad    Social History   Social History  . Marital Status: Married    Spouse Name: N/A  . Number of Children: N/A  . Years of Education: N/A   Occupational History  . Not on file.   Social History Main Topics  . Smoking  status: Current Every Day Smoker -- 1.00 packs/day    Types: Cigarettes  . Smokeless tobacco: Current User     Comment: pt states she smoke a packa day  . Alcohol Use: No  . Drug Use: No  . Sexual Activity:    Partners: Male    Birth Control/ Protection: None   Other Topics Concern  . Not on file   Social History Narrative     Review of Systems: Constitutional: negative for chills, fever, night sweats, weight changes, or fatigue  HEENT: negative for vision changes, hearing loss, congestion, rhinorrhea, ST, epistaxis, or sinus pressure Cardiovascular: negative for chest pain or palpitations Respiratory: negative for hemoptysis, wheezing, shortness of breath, or cough Abdominal: negative for vomiting, diarrhea, or constipation Dermatological: negative for rash Neurologic: negative for headache, dizziness, or syncope All other systems reviewed and are otherwise negative with the exception to those above and in the HPI.   Physical Exam: Blood pressure 114/72, pulse 59, temperature 98.1 F (36.7 C), temperature source Oral, resp. rate 14, height 5' 7.5" (1.715 m), weight 202 lb 6.4 oz (91.808 kg), last menstrual period 03/08/2012, SpO2 98 %., Body mass index is 31.21 kg/(m^2). General: Well developed, well nourished, in no acute distress. Head: Normocephalic, atraumatic, eyes without discharge, sclera non-icteric, nares are without discharge. Bilateral auditory canals clear, TM's are without  perforation, pearly grey and translucent with reflective cone of light bilaterally. Oral cavity moist, posterior pharynx without exudate, erythema, peritonsillar abscess, or post nasal drip.  Neck: Supple. No thyromegaly. Full ROM. No lymphadenopathy. Lungs: Clear bilaterally to auscultation without wheezes, rales, or rhonchi. Breathing is unlabored. Heart: RRR with S1 S2. No murmurs, rubs, or gallops appreciated. Abdomen: Soft, non-distended with normoactive bowel sounds. No hepatomegaly. No  guarding. No obvious abdominal masses. Tender LLQ with some rebound Msk:  Strength and tone normal for age. Extremities/Skin: Warm and dry. No clubbing or cyanosis. No edema. No rashes or suspicious lesions. Neuro: Alert and oriented X 3. Moves all extremities spontaneously. Gait is normal. CNII-XII grossly in tact. Psych:  Responds to questions appropriately with a normal affect.    ASSESSMENT AND PLAN:  54 y.o. year old female with acute diverticulitis. We reviewed the consequences of worsening disease and the importance of clear liquids. This chart was scribed in my presence and reviewed by me personally.    ICD-9-CM ICD-10-CM   1. Diverticulitis of colon 562.11 K57.32 amoxicillin-clavulanate (AUGMENTIN) 875-125 MG per tablet      Signed, Robyn Haber, MD 05/24/2015 10:14 AM

## 2015-11-17 ENCOUNTER — Emergency Department (HOSPITAL_BASED_OUTPATIENT_CLINIC_OR_DEPARTMENT_OTHER)
Admission: EM | Admit: 2015-11-17 | Discharge: 2015-11-17 | Disposition: A | Discharge status: Home / Self Care | Payer: 59 | Attending: Emergency Medicine | Admitting: Emergency Medicine

## 2015-11-17 ENCOUNTER — Encounter (HOSPITAL_BASED_OUTPATIENT_CLINIC_OR_DEPARTMENT_OTHER): Payer: Self-pay

## 2015-11-17 ENCOUNTER — Emergency Department (HOSPITAL_BASED_OUTPATIENT_CLINIC_OR_DEPARTMENT_OTHER): Payer: 59

## 2015-11-17 DIAGNOSIS — Z8619 Personal history of other infectious and parasitic diseases: Secondary | ICD-10-CM | POA: Insufficient documentation

## 2015-11-17 DIAGNOSIS — E119 Type 2 diabetes mellitus without complications: Secondary | ICD-10-CM | POA: Insufficient documentation

## 2015-11-17 DIAGNOSIS — Z792 Long term (current) use of antibiotics: Secondary | ICD-10-CM | POA: Diagnosis not present

## 2015-11-17 DIAGNOSIS — R1031 Right lower quadrant pain: Secondary | ICD-10-CM | POA: Diagnosis not present

## 2015-11-17 DIAGNOSIS — F17210 Nicotine dependence, cigarettes, uncomplicated: Secondary | ICD-10-CM | POA: Insufficient documentation

## 2015-11-17 DIAGNOSIS — R011 Cardiac murmur, unspecified: Secondary | ICD-10-CM | POA: Insufficient documentation

## 2015-11-17 DIAGNOSIS — Z8639 Personal history of other endocrine, nutritional and metabolic disease: Secondary | ICD-10-CM | POA: Insufficient documentation

## 2015-11-17 DIAGNOSIS — I10 Essential (primary) hypertension: Secondary | ICD-10-CM | POA: Insufficient documentation

## 2015-11-17 DIAGNOSIS — Z8719 Personal history of other diseases of the digestive system: Secondary | ICD-10-CM | POA: Insufficient documentation

## 2015-11-17 DIAGNOSIS — Z86018 Personal history of other benign neoplasm: Secondary | ICD-10-CM | POA: Diagnosis not present

## 2015-11-17 DIAGNOSIS — Z9851 Tubal ligation status: Secondary | ICD-10-CM | POA: Diagnosis not present

## 2015-11-17 HISTORY — DX: Diverticulitis of intestine, part unspecified, without perforation or abscess without bleeding: K57.92

## 2015-11-17 LAB — COMPREHENSIVE METABOLIC PANEL
ALBUMIN: 4 g/dL (ref 3.5–5.0)
ALK PHOS: 62 U/L (ref 38–126)
ALT: 14 U/L (ref 14–54)
AST: 12 U/L — ABNORMAL LOW (ref 15–41)
Anion gap: 10 (ref 5–15)
BUN: 10 mg/dL (ref 6–20)
CHLORIDE: 99 mmol/L — AB (ref 101–111)
CO2: 25 mmol/L (ref 22–32)
Calcium: 10.4 mg/dL — ABNORMAL HIGH (ref 8.9–10.3)
Creatinine, Ser: 0.65 mg/dL (ref 0.44–1.00)
GFR calc Af Amer: >60 mL/min (ref >60)
Glucose, Bld: 197 mg/dL — ABNORMAL HIGH (ref 65–99)
Potassium: 4.3 mmol/L (ref 3.5–5.1)
SODIUM: 134 mmol/L — AB (ref 135–145)
TOTAL PROTEIN: 7.2 g/dL (ref 6.5–8.1)
Total Bilirubin: 0.3 mg/dL (ref 0.3–1.2)

## 2015-11-17 LAB — CBC WITH DIFFERENTIAL/PLATELET
BASOS PCT: 0 %
Basophils Absolute: 0 10*3/uL (ref 0.0–0.1)
EOS ABS: 0 10*3/uL (ref 0.0–0.7)
EOS PCT: 0 %
HCT: 39.1 % (ref 36.0–46.0)
Hemoglobin: 13.3 g/dL (ref 12.0–15.0)
Lymphocytes Relative: 13 %
Lymphs Abs: 1.8 10*3/uL (ref 0.7–4.0)
MCH: 30.5 pg (ref 26.0–34.0)
MCHC: 34 g/dL (ref 30.0–36.0)
MCV: 89.7 fL (ref 78.0–100.0)
MONO ABS: 0.4 10*3/uL (ref 0.1–1.0)
MONOS PCT: 3 %
Neutro Abs: 12 10*3/uL — ABNORMAL HIGH (ref 1.7–7.7)
Neutrophils Relative %: 84 %
PLATELETS: 416 10*3/uL — AB (ref 150–400)
RBC: 4.36 MIL/uL (ref 3.87–5.11)
RDW: 14.1 % (ref 11.5–15.5)
WBC: 14.2 10*3/uL — ABNORMAL HIGH (ref 4.0–10.5)

## 2015-11-17 LAB — URINE MICROSCOPIC-ADD ON: WBC UA: NONE SEEN WBC/hpf (ref 0–5)

## 2015-11-17 LAB — URINALYSIS, ROUTINE W REFLEX MICROSCOPIC
Bilirubin Urine: NEGATIVE
Glucose, UA: 500 mg/dL — AB
Hgb urine dipstick: NEGATIVE
KETONES UR: 40 mg/dL — AB
LEUKOCYTES UA: NEGATIVE
NITRITE: NEGATIVE
PH: 6 (ref 5.0–8.0)
Protein, ur: 30 mg/dL — AB
Specific Gravity, Urine: 1.028 (ref 1.005–1.030)

## 2015-11-17 LAB — LIPASE, BLOOD: Lipase: 17 U/L (ref 11–51)

## 2015-11-17 MED ORDER — IOHEXOL 300 MG/ML  SOLN
100.0000 mL | Freq: Once | INTRAMUSCULAR | Status: AC | PRN
  Administered 2015-11-17: 100 mL via INTRAVENOUS

## 2015-11-17 MED ORDER — IOHEXOL 300 MG/ML  SOLN
50.0000 mL | Freq: Once | INTRAMUSCULAR | Status: AC | PRN
  Administered 2015-11-17: 50 mL via ORAL

## 2015-11-17 MED ORDER — ONDANSETRON HCL 4 MG/2ML IJ SOLN
4.0000 mg | Freq: Once | INTRAMUSCULAR | Status: AC
  Administered 2015-11-17: 4 mg via INTRAVENOUS
  Filled 2015-11-17: qty 2

## 2015-11-17 MED ORDER — MORPHINE SULFATE (PF) 4 MG/ML IV SOLN
4.0000 mg | Freq: Once | INTRAVENOUS | Status: AC
  Administered 2015-11-17: 4 mg via INTRAVENOUS
  Filled 2015-11-17: qty 1

## 2015-11-17 MED ORDER — HYDROCODONE-ACETAMINOPHEN 5-325 MG PO TABS
1.0000 | ORAL_TABLET | Freq: Four times a day (QID) | ORAL | Status: DC | PRN
Start: 2015-11-17 — End: 2017-06-14

## 2015-11-17 NOTE — ED Notes (Signed)
Pt reports several days of rlq abd pain, no radiation.  One episode of vomiting today.

## 2015-11-17 NOTE — ED Provider Notes (Signed)
CSN: [id number]     Arrival date & time 11/17/15  1701 History   First MD Initiated Contact with Patient 11/17/15 1849     Chief Complaint  Patient presents with  . Abdominal Pain     (Consider location/radiation/quality/duration/timing/severity/associated sxs/prior Treatment) HPI Comments: Patient presents today with a chief complaint of RLQ abdominal pain.  She states that the pain has been present for the past 2 days.  Initially the pain was intermittent, but has been constant since this morning.  She states that she has taken Tylenol for the pain with no relief.  Nothing makes the pain better or worse.  No association with eating.  She reports one episode of vomiting today, but no other symptoms.  No diarrhea, urinary symptoms, fever, chills, constipation, or vaginal discharge.    The history is provided by the patient.    Past Medical History  Diagnosis Date  . Diabetes mellitus   . Hypertension   . Hyperlipemia   . Measles   . Yeast vaginitis   . Chlamydia   . Fibroid   . Murmur   . Diverticulitis    Past Surgical History  Procedure Laterality Date  . Tubal ligation     Family History  Problem Relation Age of Onset  . Diabetes Father   . Hypertension Father   . Diabetes Mother   . Hypertension Mother   . Diabetes Sister   . Stroke Sister    Social History  Substance Use Topics  . Smoking status: Current Every Day Smoker -- 1.00 packs/day    Types: Cigarettes  . Smokeless tobacco: Current User     Comment: pt states she smoke a packa day  . Alcohol Use: No   OB History    Gravida Para Term Preterm AB TAB SAB Ectopic Multiple Living   2 2 2       2      Review of Systems  All other systems reviewed and are negative.     Allergies  Altace and Tenormin  Home Medications   Prior to Admission medications   Medication Sig Start Date End Date Taking? Authorizing Provider  amLODipine (NORVASC) 10 MG tablet  03/27/14   Historical Provider, MD   amoxicillin-clavulanate (AUGMENTIN) 875-125 MG per tablet Take 1 tablet by mouth 2 (two) times daily. 05/24/15   Robyn Haber, MD  glimepiride [facility]) 2 MG tablet  03/27/14   Historical Provider, MD  losartan-hydrochlorothiazide [facility]) 100-25 MG per tablet  03/27/14   Historical Provider, MD  metFORMIN (GLUCOPHAGE) 1000 MG tablet  04/22/14   Historical Provider, MD   BP 146/76 mmHg  Pulse 62  Temp(Src) 98.9 F (37.2 C) (Oral)  Resp 16  Ht 5\' 7"  (1.702 m)  Wt 89.812 kg  BMI 31.00 kg/m2  SpO2 97%  LMP 03/08/2012 Physical Exam  Constitutional: She appears well-developed and well-nourished.  HENT:  Head: Normocephalic and atraumatic.  Mouth/Throat: Oropharynx is clear and moist.  Neck: Normal range of motion. Neck supple.  Cardiovascular: Normal rate, regular rhythm and normal heart sounds.   Pulmonary/Chest: Effort normal and breath sounds normal.  Abdominal: Soft. Bowel sounds are normal. She exhibits no distension and no mass. There is tenderness in the right lower quadrant. There is no rebound, no guarding and negative Murphy's sign.  NO RUQ tenderness to palpation  Musculoskeletal: Normal range of motion.  Neurological: She is alert.  Skin: Skin is warm and dry.  Psychiatric: She has a normal mood and affect.  Nursing note  and vitals reviewed.   ED Course  Procedures (including critical care time) Labs Review Labs Reviewed  URINALYSIS, ROUTINE W REFLEX MICROSCOPIC (NOT AT [facility]) - Abnormal; Notable for the following:    Glucose, UA 500 (*)    Ketones, ur 40 (*)    Protein, ur 30 (*)    All other components within normal limits  URINE MICROSCOPIC-ADD ON - Abnormal; Notable for the following:    Squamous Epithelial / LPF 0-5 (*)    Bacteria, UA RARE (*)    All other components within normal limits  CBC WITH DIFFERENTIAL/PLATELET  COMPREHENSIVE METABOLIC PANEL  LIPASE, BLOOD    Imaging Review No results found. I have personally reviewed and evaluated these images  and lab results as part of my medical decision-making.   EKG Interpretation None      MDM   Final diagnoses:  RLQ abdominal pain   Patient presents today with RLQ abdominal pain.  Labs unremarkable aside from mild leukocytosis.  CT ab/pelvis ordered to rule out an Appendicitis.  CT results as above showing no acute findings. Discussed at length the results of the CT with patient and she is aware of the need for OB/GYN and possible GI follow up.  She reports that she has been informed of these findings in the past.  Pain improved in the ED.  Feel that the patient is stable for discharge.  Return precautions given.    Hyman Bible, PA-C 11/19/15 2149  Leonard Schwartz, MD 11/20/15 [phone]

## 2015-11-17 NOTE — Discharge Instructions (Signed)
The CT scan today showed thickening of the endometrium of the uterus.  It is recommended to follow up with OB/GYN to get testing due to concern for cancer.

## 2015-11-19 ENCOUNTER — Other Ambulatory Visit: Payer: Self-pay | Admitting: Family Medicine

## 2015-11-19 DIAGNOSIS — K838 Other specified diseases of biliary tract: Secondary | ICD-10-CM

## 2015-11-26 ENCOUNTER — Ambulatory Visit
Admission: RE | Admit: 2015-11-26 | Discharge: 2015-11-26 | Disposition: A | Discharge status: Home / Self Care | Payer: 59 | Source: Ambulatory Visit | Attending: Family Medicine | Admitting: Family Medicine

## 2015-11-26 DIAGNOSIS — K838 Other specified diseases of biliary tract: Secondary | ICD-10-CM

## 2015-11-26 MED ORDER — GADOBENATE DIMEGLUMINE 529 MG/ML IV SOLN
18.0000 mL | Freq: Once | INTRAVENOUS | Status: AC | PRN
  Administered 2015-11-26: 18 mL via INTRAVENOUS

## 2015-12-24 ENCOUNTER — Encounter: Payer: 59 | Admitting: Physician Assistant

## 2015-12-24 NOTE — Patient Instructions (Signed)
     IF you received an x-ray today, you will receive an invoice from [facility]. Please contact  Radiology at [phone] with questions or concerns regarding your invoice.   IF you received labwork today, you will receive an invoice from [facility]. Please contact [facility] at [phone] with questions or concerns regarding your invoice.   Our billing staff will not be able to assist you with questions regarding bills from these companies.  You will be contacted with the lab results as soon as they are available. The fastest way to get your results is to activate your My Chart account. Instructions are located on the last page of this paperwork. If you have not heard from us regarding the results in 2 weeks, please contact this office.      

## 2015-12-24 NOTE — Progress Notes (Signed)
This encounter was created in error - please disregard.

## 2015-12-24 NOTE — Progress Notes (Deleted)
   Subjective:    Patient ID: Nicole Houston, female    DOB: 06-29-1961, 55 y.o.   MRN: [id number]  Chief Complaint  Patient presents with  . Possible Flu    Due to husband being sick    HPI    Review of Systems     Objective:   Physical Exam        Assessment & Plan:

## 2016-05-20 ENCOUNTER — Encounter: Payer: 59 | Attending: Family Medicine | Admitting: Dietician

## 2016-05-20 ENCOUNTER — Encounter: Payer: Self-pay | Admitting: Dietician

## 2016-05-20 DIAGNOSIS — E118 Type 2 diabetes mellitus with unspecified complications: Secondary | ICD-10-CM

## 2016-05-20 DIAGNOSIS — Z029 Encounter for administrative examinations, unspecified: Secondary | ICD-10-CM | POA: Diagnosis present

## 2016-05-20 NOTE — Progress Notes (Signed)
Diabetes Self-Management Education  Visit Type: First/Initial  Appt. Start Time: 0925 Appt. End Time: 1030  05/20/2016  [name], identified by name and date of birth, is a 55 y.o. female with a diagnosis of Diabetes: Type 2. She has also been having problems with diverticulitis and is currently on medication for this.  She reports following a vegetarian diet for about 5 years and then 2 years ago resumed intake of meat and decreased her plant food intake/fiber.  Diverticulitis started at that time.  Other hx includes THN, hyperlipidemia and smoking.  Medications include glimepiride and Metformin.  She was taking this just with fruit at times.  Weight hx: Lowest adult weight 193 lbs which is today's weight. Highest adult weight 260 lbs 17 years ago when she was diagnosed with diabetes. UBW 200 lbs.  Current weight is down due to diverticulitis flair.  Patient lives with her husband.  She does the shopping and cooking.  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  ASSESSMENT  Height 5\' 8"  (1.727 m), weight 193 lb (87.5 kg), last menstrual period 03/08/2012. Body mass index is 29.35 kg/m.      Diabetes Self-Management Education - 05/20/16 0930      Visit Information   Visit Type First/Initial     Initial Visit   Diabetes Type Type 2   Are you currently following a meal plan? No   Are you taking your medications as prescribed? Yes   Date Diagnosed 2000     Health Coping   How would you rate your overall health? Good     Psychosocial Assessment   Patient Belief/Attitude about Diabetes Motivated to manage diabetes   Self-care barriers None   Self-management support Doctor's office;Family   Other persons present Patient   Patient Concerns Nutrition/Meal planning   Special Needs None   Preferred Learning Style No preference indicated   Learning Readiness Ready   How often do you need to have someone help you when you read instructions,  pamphlets, or other written materials from your doctor or pharmacy? 1 - Never   What is the last grade level you completed in school? 4 years college     Pre-Education Assessment   Patient understands the diabetes disease and treatment process. Needs Review   Patient understands incorporating nutritional management into lifestyle. Needs Instruction   Patient undertands incorporating physical activity into lifestyle. Needs Instruction   Patient understands using medications safely. Demonstrates understanding / competency   Patient understands monitoring blood glucose, interpreting and using results Needs Review   Patient understands prevention, detection, and treatment of acute complications. Needs Review   Patient understands prevention, detection, and treatment of chronic complications. Needs Review   Patient understands how to develop strategies to address psychosocial issues. Needs Review   Patient understands how to develop strategies to promote health/change behavior. Needs Instruction     Complications   Last HgB A1C per patient/outside source 7.6 %  estimated 01/2016   How often do you check your blood sugar? 1-2 times/day   Fasting Blood glucose range (mg/dL) 70-129   Number of hypoglycemic episodes per month 0   Number of hyperglycemic episodes per week 0   Have you had a dilated eye exam in the past 12 months? Yes   Have you had a dental exam in the past 12 months? No   Are you checking your feet? No     Dietary Intake   Breakfast fruit  Snack (morning) none   Lunch leftovers or salad OR [employer] recently or other fast food   Snack (afternoon) chex mix or cheese its or fig newtons or trail mix   Dinner grilled chicken or occasionally fried, vegetable   Snack (evening) apple with peanut butter or chips or cheese and crackers  7:30   Beverage(s) water, coffee with sugar sub and cream, rare sweet tea     Exercise   Exercise Type Light (walking / raking leaves)   How many days  per week to you exercise? 2   How many minutes per day do you exercise? 30   Total minutes per week of exercise 60     Patient Education   Previous Diabetes Education No   Disease state  Definition of diabetes, type 1 and 2, and the diagnosis of diabetes   Nutrition management  Role of diet in the treatment of diabetes and the relationship between the three main macronutrients and blood glucose level;Food label reading, portion sizes and measuring food.;Carbohydrate counting;Information on hints to eating out and maintain blood glucose control.;Meal options for control of blood glucose level and chronic complications.;Other (comment)  diet and diverticulitis guidelines   Physical activity and exercise  Role of exercise on diabetes management, blood pressure control and cardiac health.   Medications Reviewed patients medication for diabetes, action, purpose, timing of dose and side effects.   Monitoring Identified appropriate SMBG and/or A1C goals.;Yearly dilated eye exam;Daily foot exams;Purpose and frequency of SMBG.   Acute complications Taught treatment of hypoglycemia - the 15 rule.   Chronic complications Relationship between chronic complications and blood glucose control;Assessed and discussed foot care and prevention of foot problems   Psychosocial adjustment Worked with patient to identify barriers to care and solutions;Role of stress on diabetes   Personal strategies to promote health Lifestyle issues that need to be addressed for better diabetes care;Review risk of smoking and offered smoking cessation     Individualized Goals (developed by patient)   Nutrition Follow meal plan discussed;General guidelines for healthy choices and portions discussed   Physical Activity Exercise 5-7 days per week;30 minutes per day   Medications take my medication as prescribed   Monitoring  test my blood glucose as discussed   Reducing Risk stop smoking;examine blood glucose patterns      Post-Education Assessment   Patient understands the diabetes disease and treatment process. Demonstrates understanding / competency   Patient understands incorporating nutritional management into lifestyle. Demonstrates understanding / competency   Patient undertands incorporating physical activity into lifestyle. Demonstrates understanding / competency   Patient understands using medications safely. Demonstrates understanding / competency   Patient understands monitoring blood glucose, interpreting and using results Demonstrates understanding / competency   Patient understands prevention, detection, and treatment of acute complications. Demonstrates understanding / competency   Patient understands prevention, detection, and treatment of chronic complications. Demonstrates understanding / competency   Patient understands how to develop strategies to address psychosocial issues. Demonstrates understanding / competency   Patient understands how to develop strategies to promote health/change behavior. Demonstrates understanding / competency     Outcomes   Expected Outcomes Demonstrated interest in learning. Expect positive outcomes   Future DMSE PRN   Program Status Completed      Individualized Plan for Diabetes Self-Management Training:   Learning Objective:  Patient will have a greater understanding of diabetes self-management. Patient education plan is to attend individual and/or group sessions per assessed needs and concerns.   Plan:  Patient Instructions  Low fiber diet for the next 1-2 weeks then increase your fiber gradually to 25 grams per day. Consider a probiotic. Continue to work to quit smoking. Be as active as possible.  Aim for 30 minutes most days. Aim for 3 Carb Choices per meal (45 grams) +/- 1 either way  Aim for 0-1 Carbs per snack if hungry  Include protein in moderation with your meals and snacks Consider reading food labels for Total Carbohydrate and Fat Grams of  foods Consider checking BG at alternate times per day as directed by MD  Consider taking medication as directed by MD      Expected Outcomes:  Demonstrated interest in learning. Expect positive outcomes  Education material provided: Living Well with Diabetes, Food label handouts, A1C conversion sheet, Meal plan card, My Plate and Snack sheet, breakfast ideas, eating out tips for fast food and dining  If problems or questions, patient to contact team via:  Phone, e-mail  Future DSME appointment: PRN

## 2016-05-20 NOTE — Patient Instructions (Signed)
Low fiber diet for the next 1-2 weeks then increase your fiber gradually to 25 grams per day. Consider a probiotic. Continue to work to quit smoking. Be as active as possible.  Aim for 30 minutes most days. Aim for 3 Carb Choices per meal (45 grams) +/- 1 either way  Aim for 0-1 Carbs per snack if hungry  Include protein in moderation with your meals and snacks Consider reading food labels for Total Carbohydrate and Fat Grams of foods Consider checking BG at alternate times per day as directed by MD  Consider taking medication as directed by MD

## 2016-05-27 ENCOUNTER — Ambulatory Visit
Admission: RE | Admit: 2016-05-27 | Discharge: 2016-05-27 | Disposition: A | Discharge status: Home / Self Care | Payer: 59 | Source: Ambulatory Visit | Attending: Family Medicine | Admitting: Family Medicine

## 2016-05-27 ENCOUNTER — Other Ambulatory Visit: Payer: Self-pay | Admitting: Family Medicine

## 2016-05-27 DIAGNOSIS — K5792 Diverticulitis of intestine, part unspecified, without perforation or abscess without bleeding: Secondary | ICD-10-CM

## 2016-05-27 DIAGNOSIS — R109 Unspecified abdominal pain: Secondary | ICD-10-CM

## 2016-05-27 MED ORDER — IOPAMIDOL (ISOVUE-300) INJECTION 61%
100.0000 mL | Freq: Once | INTRAVENOUS | Status: AC | PRN
  Administered 2016-05-27: 100 mL via INTRAVENOUS

## 2016-05-27 MED ORDER — IOHEXOL 300 MG/ML  SOLN
30.0000 mL | Freq: Once | INTRAMUSCULAR | Status: AC | PRN
  Administered 2016-05-27: 30 mL via ORAL

## 2016-05-31 ENCOUNTER — Other Ambulatory Visit: Payer: Self-pay | Admitting: Physician Assistant

## 2016-05-31 DIAGNOSIS — R1032 Left lower quadrant pain: Secondary | ICD-10-CM

## 2016-05-31 DIAGNOSIS — R9389 Abnormal findings on diagnostic imaging of other specified body structures: Secondary | ICD-10-CM

## 2016-06-04 ENCOUNTER — Ambulatory Visit: Payer: 59 | Admitting: *Deleted

## 2016-06-09 ENCOUNTER — Other Ambulatory Visit: Payer: 59

## 2016-11-07 ENCOUNTER — Emergency Department (HOSPITAL_COMMUNITY): Payer: 59

## 2016-11-07 ENCOUNTER — Encounter (HOSPITAL_COMMUNITY): Payer: Self-pay | Admitting: Emergency Medicine

## 2016-11-07 ENCOUNTER — Emergency Department (HOSPITAL_COMMUNITY)
Admission: EM | Admit: 2016-11-07 | Discharge: 2016-11-07 | Disposition: A | Discharge status: Home / Self Care | Payer: 59 | Attending: Emergency Medicine | Admitting: Emergency Medicine

## 2016-11-07 DIAGNOSIS — E109 Type 1 diabetes mellitus without complications: Secondary | ICD-10-CM | POA: Diagnosis not present

## 2016-11-07 DIAGNOSIS — Z7984 Long term (current) use of oral hypoglycemic drugs: Secondary | ICD-10-CM | POA: Diagnosis not present

## 2016-11-07 DIAGNOSIS — I10 Essential (primary) hypertension: Secondary | ICD-10-CM | POA: Insufficient documentation

## 2016-11-07 DIAGNOSIS — R079 Chest pain, unspecified: Secondary | ICD-10-CM | POA: Diagnosis not present

## 2016-11-07 DIAGNOSIS — F17210 Nicotine dependence, cigarettes, uncomplicated: Secondary | ICD-10-CM | POA: Diagnosis not present

## 2016-11-07 DIAGNOSIS — R0789 Other chest pain: Secondary | ICD-10-CM

## 2016-11-07 LAB — I-STAT TROPONIN, ED
Troponin i, poc: 0 ng/mL (ref 0.00–0.08)
Troponin i, poc: 0 ng/mL (ref 0.00–0.08)

## 2016-11-07 LAB — CBC
HCT: 40 % (ref 36.0–46.0)
Hemoglobin: 13.2 g/dL (ref 12.0–15.0)
MCH: 30.4 pg (ref 26.0–34.0)
MCHC: 33 g/dL (ref 30.0–36.0)
MCV: 92.2 fL (ref 78.0–100.0)
PLATELETS: 341 10*3/uL (ref 150–400)
RBC: 4.34 MIL/uL (ref 3.87–5.11)
RDW: 13.8 % (ref 11.5–15.5)
WBC: 8.6 10*3/uL (ref 4.0–10.5)

## 2016-11-07 LAB — BASIC METABOLIC PANEL
Anion gap: 10 (ref 5–15)
BUN: 10 mg/dL (ref 6–20)
CALCIUM: 11.5 mg/dL — AB (ref 8.9–10.3)
CO2: 25 mmol/L (ref 22–32)
CREATININE: 0.67 mg/dL (ref 0.44–1.00)
Chloride: 104 mmol/L (ref 101–111)
GFR calc Af Amer: >60 mL/min (ref >60)
GFR calc non Af Amer: >60 mL/min (ref >60)
GLUCOSE: 271 mg/dL — AB (ref 65–99)
Potassium: 4.3 mmol/L (ref 3.5–5.1)
Sodium: 139 mmol/L (ref 135–145)

## 2016-11-07 MED ORDER — OMEPRAZOLE 20 MG PO CPDR: 20.0000 mg | DELAYED_RELEASE_CAPSULE | Freq: Two times a day (BID) | ORAL | 0 refills | Status: DC

## 2016-11-07 MED ORDER — RANITIDINE HCL 150 MG PO CAPS: 150.0000 mg | ORAL_CAPSULE | Freq: Every day | ORAL | 0 refills | Status: DC

## 2016-11-07 MED ORDER — GI COCKTAIL ~~LOC~~
30.0000 mL | Freq: Once | ORAL | Status: AC
  Administered 2016-11-07: 30 mL via ORAL
  Filled 2016-11-07: qty 30

## 2016-11-07 NOTE — ED Triage Notes (Signed)
Pt.l stated, I started having some chest tightness and off and on nausea.

## 2016-11-07 NOTE — Discharge Instructions (Signed)
Please follow up with your doctor for further evaluation of your condition. You may benefit from an outpatient cardiac work up for your chest pain.  Take prilosec and zantac 30 minutes before meal to help with your pain.

## 2016-11-07 NOTE — ED Provider Notes (Signed)
[facility] DEPT Provider Note   CSN: [id number] Arrival date & time: 11/07/16  [id number]     History   Chief Complaint Chief Complaint  Patient presents with  . Chest Pain  . Nausea    HPI Nicole Houston is a 56 y.o. female.  HPI   56 year old obese female with history of diabetes, hypertension, hyperlipidemia presenting complaining of chest pain. Patient report yesterday afternoon after she ate lunch at a [employer] she developed pressure sensation to her mid chest that is nonradiating but associated with dizziness, mildly nauseous, and mild shortness of breath. Episode lasting for approximately 45 minutes, improved after she belches. She did took an aspirin and some Tums as well, thinking it might be indigestion. This morning prior to going for breakfast but after she drinks her coffee she once again developed the same midsternal chest discomfort.. Symptom has been ongoing for approximately 2 hours but mostly resolved. She is going through menopause and does have hot flash. Patient is a smoker, strong family history of cardiac disease, has not have a formal cardiac workup or cardiac stress test. Denies history of GERD. Denies any recent recent strenuous activities or heavy lifting. No prior history of PE or DVT, no recent surgery, prolonged bed rest, unilateral leg swelling or calf pain, active cancer or hemoptysis. Denies having abdominal pain or dysuria.  Past Medical History:  Diagnosis Date  . Chlamydia   . Diabetes mellitus   . Diverticulitis   . Fibroid   . Hyperlipemia   . Hypertension   . Measles   . Murmur   . Yeast vaginitis     Patient Active Problem List   Diagnosis Date Noted  . Dilated bile duct 05/01/2014  . Cholelithiasis 05/01/2014  . Hypertension 01/17/2012  . Obesity 01/17/2012  . 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 Hypercholesterolemia 01/17/2012  . Menorrhagia 01/17/2012  . Fibroid uterus 01/17/2012  . Cigarette smoker  01/17/2012    Past Surgical History:  Procedure Laterality Date  . TUBAL LIGATION      OB History    Gravida Para Term Preterm AB Living   2 2 2     2    SAB TAB Ectopic Multiple Live Births                   Home Medications    Prior to Admission medications   Medication Sig Start Date End Date Taking? Authorizing Provider  amLODipine (NORVASC) 10 MG tablet  03/27/14   Historical Provider, MD  amoxicillin-clavulanate (AUGMENTIN) 875-125 MG per tablet Take 1 tablet by mouth 2 (two) times daily. Patient not taking: Reported on 12/24/2015 05/24/15   Robyn Haber, MD  glimepiride Jari Sportsman) 2 MG tablet  03/27/14   Historical Provider, MD  HYDROcodone-acetaminophen (NORCO/VICODIN) 5-325 MG tablet Take 1-2 tablets by mouth every 6 (six) hours as needed. 11/17/15   Hyman Bible, PA-C  losartan-hydrochlorothiazide (HYZAAR) 100-25 MG per tablet  03/27/14   Historical Provider, MD  metFORMIN (GLUCOPHAGE) 1000 MG tablet  04/22/14   Historical Provider, MD    Family History Family History  Problem Relation Age of Onset  . Diabetes Father   . Hypertension Father   . Diabetes Mother   . Hypertension Mother   . Diabetes Sister   . Stroke Sister     Social History Social History  Substance Use Topics  . Smoking status: Current Every Day Smoker    Packs/day: 1.00    Types: Cigarettes  .  Smokeless tobacco: Current User     Comment: pt states she smoke a packa day  . Alcohol use No     Allergies   Altace [ramipril] and Tenormin [atenolol]   Review of Systems Review of Systems  All other systems reviewed and are negative.    Physical Exam Updated Vital Signs BP 155/81 (BP Location: Right Arm)   Pulse 62   Temp 98.3 F (36.8 C) (Oral)   Resp 17   Ht 5\' 7"  (1.702 m)   Wt 88 kg   LMP 03/08/2012   SpO2 96%   BMI 30.38 kg/m   Physical Exam  Constitutional: She is oriented to person, place, and time. She appears well-developed and well-nourished. No distress.  Awake,  alert, nontoxic appearance  HENT:  Head: Atraumatic.  Eyes: Conjunctivae are normal.  Neck: Neck supple. No JVD present.  Cardiovascular: Normal rate and regular rhythm.   Pulmonary/Chest: Effort normal. No respiratory distress. She exhibits no tenderness.  Abdominal: Soft. There is no tenderness. There is no rebound.  Musculoskeletal: She exhibits no edema or tenderness.  ROM appears intact, no obvious focal weakness  Neurological: She is alert and oriented to person, place, and time.  Mental status and motor strength appears intact  Skin: No rash noted.  Psychiatric: She has a normal mood and affect.  Nursing note and vitals reviewed.    ED Treatments / Results  Labs (all labs ordered are listed, but only abnormal results are displayed) Labs Reviewed  BASIC METABOLIC PANEL - Abnormal; Notable for the following:       Result Value   Glucose, Bld 271 (*)    Calcium 11.5 (*)    All other components within normal limits  CBC  I-STAT TROPOININ, ED  I-STAT TROPOININ, ED    EKG  EKG Interpretation  Date/Time:  Sunday November 07 2016 09:34:37 EST Ventricular Rate:  65 PR Interval:  170 QRS Duration: 80 QT Interval:  396 QTC Calculation: 411 R Axis:   46 Text Interpretation:  Normal sinus rhythm Normal ECG No prior for comparison.  No STEMI Confirmed by Sherry Ruffing MD, CHRISTOPHER [phone]) on 11/07/2016 9:41:28 AM       Radiology Dg Chest Port 1 View  Result Date: 11/07/2016 CLINICAL DATA:  Patient reports mid chest pain on and off X 1 week, came back this AM. HX HTN, diabetes EXAM: PORTABLE CHEST 1 VIEW COMPARISON:  None. FINDINGS: The heart size and mediastinal contours are within normal limits. Both lungs are clear. No pleural effusion or pneumothorax. The visualized skeletal structures are unremarkable. IMPRESSION: No active disease. Electronically Signed   By: Lajean Manes M.D.   On: 11/07/2016 10:32    Procedures Procedures (including critical care  time)  Medications Ordered in ED Medications  gi cocktail (Maalox,Lidocaine,Donnatal) (30 mLs Oral Given 11/07/16 1014)     Initial Impression / Assessment and Plan / ED Course  I have reviewed the triage vital signs and the nursing notes.  Pertinent labs & imaging results that were available during my care of the patient were reviewed by me and considered in my medical decision making (see chart for details).     BP 153/80   Pulse (!) 56   Temp 98.3 F (36.8 C) (Oral)   Resp 14   Ht 5\' 7"  (1.702 m)   Wt 88 kg   LMP 03/08/2012   SpO2 90%   BMI 30.38 kg/m    Final Clinical Impressions(s) / ED Diagnoses   Final diagnoses:  Atypical chest pain    New Prescriptions New Prescriptions   OMEPRAZOLE (PRILOSEC) 20 MG CAPSULE    Take 1 capsule (20 mg total) by mouth 2 (two) times daily before a meal.   RANITIDINE (ZANTAC) 150 MG CAPSULE    Take 1 capsule (150 mg total) by mouth daily.   9:59 AM Patient here with substernal chest discomfort that started yesterday and today shortly after eating. Her symptoms mostly resolved. She is well-appearing, vital signs stable. Moderate hypertensive but did not take her blood pressure medication this morning.  1:24 PM HEART score of 3, low risk of MACE.  PERC negative, doubt PE.  Negative delta troponin.  Pt is sxs free.  Report moderate improvement after GI cocktail.  Pt will f/u with PCP and likely outpt cardiology for outpt cardiac stress test.  Hx of diabetes, does have CBG 271 today.  She will need to monitor that closely.  prilosec and zantac prescribed for suspect GERD/gastritis causing pain.  Return precaution discussed.    Domenic Moras, PA-C 11/07/16 [place], MD 11/07/16 2232

## 2016-11-10 DIAGNOSIS — E1165 Type 2 diabetes mellitus with hyperglycemia: Secondary | ICD-10-CM | POA: Diagnosis not present

## 2016-11-10 DIAGNOSIS — E119 Type 2 diabetes mellitus without complications: Secondary | ICD-10-CM | POA: Diagnosis not present

## 2016-11-10 DIAGNOSIS — Z780 Asymptomatic menopausal state: Secondary | ICD-10-CM | POA: Diagnosis not present

## 2016-11-16 DIAGNOSIS — E119 Type 2 diabetes mellitus without complications: Secondary | ICD-10-CM | POA: Diagnosis not present

## 2016-11-22 ENCOUNTER — Telehealth: Payer: Self-pay

## 2016-11-22 NOTE — Telephone Encounter (Signed)
Sent note to scheduling

## 2016-12-07 ENCOUNTER — Ambulatory Visit: Payer: 59 | Admitting: Cardiology

## 2017-01-04 DIAGNOSIS — Z7984 Long term (current) use of oral hypoglycemic drugs: Secondary | ICD-10-CM | POA: Diagnosis not present

## 2017-01-04 DIAGNOSIS — Z0000 Encounter for general adult medical examination without abnormal findings: Secondary | ICD-10-CM | POA: Diagnosis not present

## 2017-01-04 DIAGNOSIS — E785 Hyperlipidemia, unspecified: Secondary | ICD-10-CM | POA: Diagnosis not present

## 2017-01-04 DIAGNOSIS — E119 Type 2 diabetes mellitus without complications: Secondary | ICD-10-CM | POA: Diagnosis not present

## 2017-01-19 DIAGNOSIS — E119 Type 2 diabetes mellitus without complications: Secondary | ICD-10-CM | POA: Diagnosis not present

## 2017-02-02 DIAGNOSIS — J209 Acute bronchitis, unspecified: Secondary | ICD-10-CM | POA: Diagnosis not present

## 2017-02-22 DIAGNOSIS — Z01411 Encounter for gynecological examination (general) (routine) with abnormal findings: Secondary | ICD-10-CM | POA: Diagnosis not present

## 2017-02-22 DIAGNOSIS — Z1231 Encounter for screening mammogram for malignant neoplasm of breast: Secondary | ICD-10-CM | POA: Diagnosis not present

## 2017-03-07 DIAGNOSIS — I10 Essential (primary) hypertension: Secondary | ICD-10-CM | POA: Diagnosis not present

## 2017-03-07 DIAGNOSIS — E785 Hyperlipidemia, unspecified: Secondary | ICD-10-CM | POA: Diagnosis not present

## 2017-03-07 DIAGNOSIS — E119 Type 2 diabetes mellitus without complications: Secondary | ICD-10-CM | POA: Diagnosis not present

## 2017-04-04 DIAGNOSIS — G589 Mononeuropathy, unspecified: Secondary | ICD-10-CM | POA: Diagnosis not present

## 2017-04-06 DIAGNOSIS — M542 Cervicalgia: Secondary | ICD-10-CM | POA: Diagnosis not present

## 2017-04-06 DIAGNOSIS — M5030 Other cervical disc degeneration, unspecified cervical region: Secondary | ICD-10-CM | POA: Diagnosis not present

## 2017-04-14 ENCOUNTER — Other Ambulatory Visit: Payer: Self-pay | Admitting: Orthopedic Surgery

## 2017-04-14 DIAGNOSIS — M542 Cervicalgia: Secondary | ICD-10-CM

## 2017-04-25 ENCOUNTER — Ambulatory Visit
Admission: RE | Admit: 2017-04-25 | Discharge: 2017-04-25 | Disposition: A | Discharge status: Home / Self Care | Payer: 59 | Source: Ambulatory Visit | Attending: Orthopedic Surgery | Admitting: Orthopedic Surgery

## 2017-04-25 DIAGNOSIS — M47812 Spondylosis without myelopathy or radiculopathy, cervical region: Secondary | ICD-10-CM | POA: Diagnosis not present

## 2017-04-25 DIAGNOSIS — M542 Cervicalgia: Secondary | ICD-10-CM

## 2017-04-25 MED ORDER — TRIAMCINOLONE ACETONIDE 40 MG/ML IJ SUSP (RADIOLOGY)
60.0000 mg | Freq: Once | INTRAMUSCULAR | Status: AC
  Administered 2017-04-25: 60 mg via EPIDURAL

## 2017-04-25 MED ORDER — IOPAMIDOL (ISOVUE-M 300) INJECTION 61%
1.0000 mL | Freq: Once | INTRAMUSCULAR | Status: AC | PRN
  Administered 2017-04-25: 1 mL via EPIDURAL

## 2017-04-25 NOTE — Discharge Instructions (Signed)

## 2017-04-28 DIAGNOSIS — M5412 Radiculopathy, cervical region: Secondary | ICD-10-CM | POA: Diagnosis not present

## 2017-05-05 DIAGNOSIS — M5083 Other cervical disc disorders, cervicothoracic region: Secondary | ICD-10-CM | POA: Diagnosis not present

## 2017-05-05 DIAGNOSIS — M542 Cervicalgia: Secondary | ICD-10-CM | POA: Diagnosis not present

## 2017-05-09 DIAGNOSIS — M5083 Other cervical disc disorders, cervicothoracic region: Secondary | ICD-10-CM | POA: Diagnosis not present

## 2017-05-09 DIAGNOSIS — M542 Cervicalgia: Secondary | ICD-10-CM | POA: Diagnosis not present

## 2017-05-11 DIAGNOSIS — M542 Cervicalgia: Secondary | ICD-10-CM | POA: Diagnosis not present

## 2017-05-11 DIAGNOSIS — M5083 Other cervical disc disorders, cervicothoracic region: Secondary | ICD-10-CM | POA: Diagnosis not present

## 2017-05-18 DIAGNOSIS — M5083 Other cervical disc disorders, cervicothoracic region: Secondary | ICD-10-CM | POA: Diagnosis not present

## 2017-05-18 DIAGNOSIS — M542 Cervicalgia: Secondary | ICD-10-CM | POA: Diagnosis not present

## 2017-05-20 DIAGNOSIS — M5083 Other cervical disc disorders, cervicothoracic region: Secondary | ICD-10-CM | POA: Diagnosis not present

## 2017-05-20 DIAGNOSIS — M542 Cervicalgia: Secondary | ICD-10-CM | POA: Diagnosis not present

## 2017-05-24 DIAGNOSIS — M542 Cervicalgia: Secondary | ICD-10-CM | POA: Diagnosis not present

## 2017-05-24 DIAGNOSIS — M5083 Other cervical disc disorders, cervicothoracic region: Secondary | ICD-10-CM | POA: Diagnosis not present

## 2017-06-01 DIAGNOSIS — M5083 Other cervical disc disorders, cervicothoracic region: Secondary | ICD-10-CM | POA: Diagnosis not present

## 2017-06-02 ENCOUNTER — Other Ambulatory Visit: Payer: Self-pay | Admitting: Orthopedic Surgery

## 2017-06-02 DIAGNOSIS — M542 Cervicalgia: Secondary | ICD-10-CM

## 2017-06-14 ENCOUNTER — Ambulatory Visit
Admission: RE | Admit: 2017-06-14 | Discharge: 2017-06-14 | Disposition: A | Discharge status: Home / Self Care | Payer: 59 | Source: Ambulatory Visit | Attending: Orthopedic Surgery | Admitting: Orthopedic Surgery

## 2017-06-14 DIAGNOSIS — M542 Cervicalgia: Secondary | ICD-10-CM

## 2017-06-14 DIAGNOSIS — R102 Pelvic and perineal pain: Secondary | ICD-10-CM | POA: Insufficient documentation

## 2017-06-14 DIAGNOSIS — M47812 Spondylosis without myelopathy or radiculopathy, cervical region: Secondary | ICD-10-CM | POA: Diagnosis not present

## 2017-06-14 DIAGNOSIS — B373 Candidiasis of vulva and vagina: Secondary | ICD-10-CM | POA: Insufficient documentation

## 2017-06-14 DIAGNOSIS — R109 Unspecified abdominal pain: Secondary | ICD-10-CM | POA: Insufficient documentation

## 2017-06-14 DIAGNOSIS — B3731 Acute candidiasis of vulva and vagina: Secondary | ICD-10-CM | POA: Insufficient documentation

## 2017-06-14 DIAGNOSIS — N83209 Unspecified ovarian cyst, unspecified side: Secondary | ICD-10-CM | POA: Insufficient documentation

## 2017-06-14 MED ORDER — IOPAMIDOL (ISOVUE-M 300) INJECTION 61%: 1.0000 mL | Freq: Once | INTRAMUSCULAR | Status: DC | PRN

## 2017-06-14 MED ORDER — TRIAMCINOLONE ACETONIDE 40 MG/ML IJ SUSP (RADIOLOGY): 60.0000 mg | Freq: Once | INTRAMUSCULAR | Status: DC

## 2017-06-14 NOTE — Discharge Instructions (Signed)

## 2017-06-28 DIAGNOSIS — J209 Acute bronchitis, unspecified: Secondary | ICD-10-CM | POA: Diagnosis not present

## 2017-09-15 DIAGNOSIS — E119 Type 2 diabetes mellitus without complications: Secondary | ICD-10-CM | POA: Diagnosis not present

## 2017-10-25 DIAGNOSIS — E611 Iron deficiency: Secondary | ICD-10-CM | POA: Diagnosis not present

## 2017-10-25 DIAGNOSIS — R5382 Chronic fatigue, unspecified: Secondary | ICD-10-CM | POA: Diagnosis not present

## 2017-10-25 DIAGNOSIS — E538 Deficiency of other specified B group vitamins: Secondary | ICD-10-CM | POA: Diagnosis not present

## 2017-10-25 DIAGNOSIS — I10 Essential (primary) hypertension: Secondary | ICD-10-CM | POA: Diagnosis not present

## 2017-10-25 DIAGNOSIS — E559 Vitamin D deficiency, unspecified: Secondary | ICD-10-CM | POA: Diagnosis not present

## 2017-12-19 DIAGNOSIS — M79605 Pain in left leg: Secondary | ICD-10-CM | POA: Diagnosis not present

## 2017-12-19 DIAGNOSIS — I868 Varicose veins of other specified sites: Secondary | ICD-10-CM | POA: Diagnosis not present

## 2017-12-19 DIAGNOSIS — M25562 Pain in left knee: Secondary | ICD-10-CM | POA: Diagnosis not present

## 2017-12-21 ENCOUNTER — Other Ambulatory Visit: Payer: Self-pay | Admitting: Family Medicine

## 2017-12-21 DIAGNOSIS — M79605 Pain in left leg: Secondary | ICD-10-CM

## 2017-12-26 ENCOUNTER — Other Ambulatory Visit: Payer: 59

## 2017-12-29 DIAGNOSIS — I87393 Chronic venous hypertension (idiopathic) with other complications of bilateral lower extremity: Secondary | ICD-10-CM | POA: Diagnosis not present

## 2017-12-29 DIAGNOSIS — I83813 Varicose veins of bilateral lower extremities with pain: Secondary | ICD-10-CM | POA: Diagnosis not present

## 2018-01-03 DIAGNOSIS — I83813 Varicose veins of bilateral lower extremities with pain: Secondary | ICD-10-CM | POA: Diagnosis not present

## 2018-01-04 DIAGNOSIS — E785 Hyperlipidemia, unspecified: Secondary | ICD-10-CM | POA: Diagnosis not present

## 2018-01-04 DIAGNOSIS — Z0000 Encounter for general adult medical examination without abnormal findings: Secondary | ICD-10-CM | POA: Diagnosis not present

## 2018-01-04 DIAGNOSIS — E119 Type 2 diabetes mellitus without complications: Secondary | ICD-10-CM | POA: Diagnosis not present

## 2018-01-04 DIAGNOSIS — I10 Essential (primary) hypertension: Secondary | ICD-10-CM | POA: Diagnosis not present

## 2018-01-26 DIAGNOSIS — I83812 Varicose veins of left lower extremities with pain: Secondary | ICD-10-CM | POA: Diagnosis not present

## 2018-01-26 DIAGNOSIS — I83892 Varicose veins of left lower extremities with other complications: Secondary | ICD-10-CM | POA: Diagnosis not present

## 2018-01-26 DIAGNOSIS — I8312 Varicose veins of left lower extremity with inflammation: Secondary | ICD-10-CM | POA: Diagnosis not present

## 2018-02-15 DIAGNOSIS — I83892 Varicose veins of left lower extremities with other complications: Secondary | ICD-10-CM | POA: Diagnosis not present

## 2018-02-15 DIAGNOSIS — I83812 Varicose veins of left lower extremities with pain: Secondary | ICD-10-CM | POA: Diagnosis not present

## 2018-02-15 DIAGNOSIS — I8312 Varicose veins of left lower extremity with inflammation: Secondary | ICD-10-CM | POA: Diagnosis not present

## 2018-02-17 DIAGNOSIS — I83892 Varicose veins of left lower extremities with other complications: Secondary | ICD-10-CM | POA: Diagnosis not present

## 2018-02-17 DIAGNOSIS — I83812 Varicose veins of left lower extremities with pain: Secondary | ICD-10-CM | POA: Diagnosis not present

## 2018-02-17 DIAGNOSIS — I8312 Varicose veins of left lower extremity with inflammation: Secondary | ICD-10-CM | POA: Diagnosis not present

## 2018-02-23 IMAGING — XA DG INJECT/[PERSON_NAME] INC NEEDLE/CATH/PLC EPI/CERV/THOR W/IMG
2 series · 2 of 2 positions shown · non-contrast
Comparison: none

CLINICAL DATA: Spondylosis without myelopathy. Right arm pain and
numbness.

[Series 1: ortho adipose · 1 of 1 slices shown (1 of 2)]
[im 1/1]
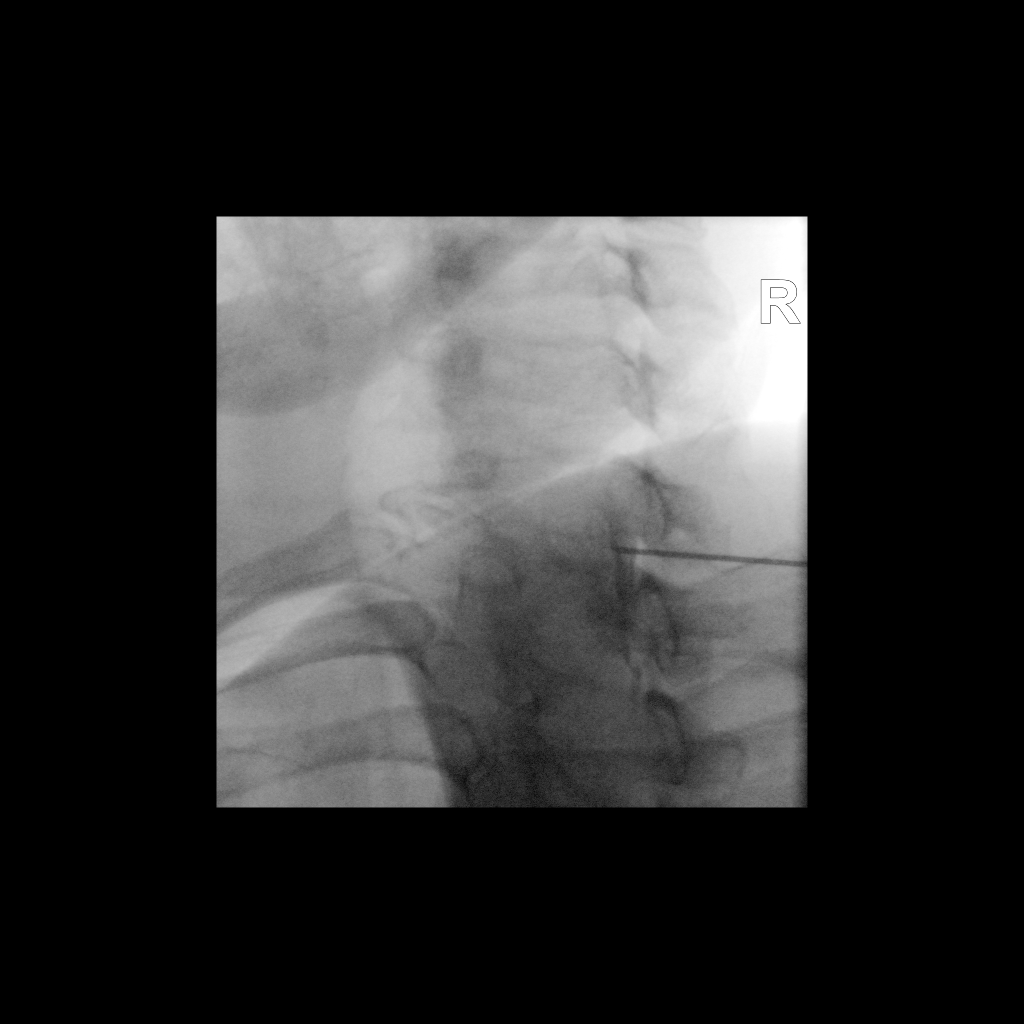

[Series 2: ortho adipose · 1 of 1 slices shown (2 of 2)]
[im 1/1]
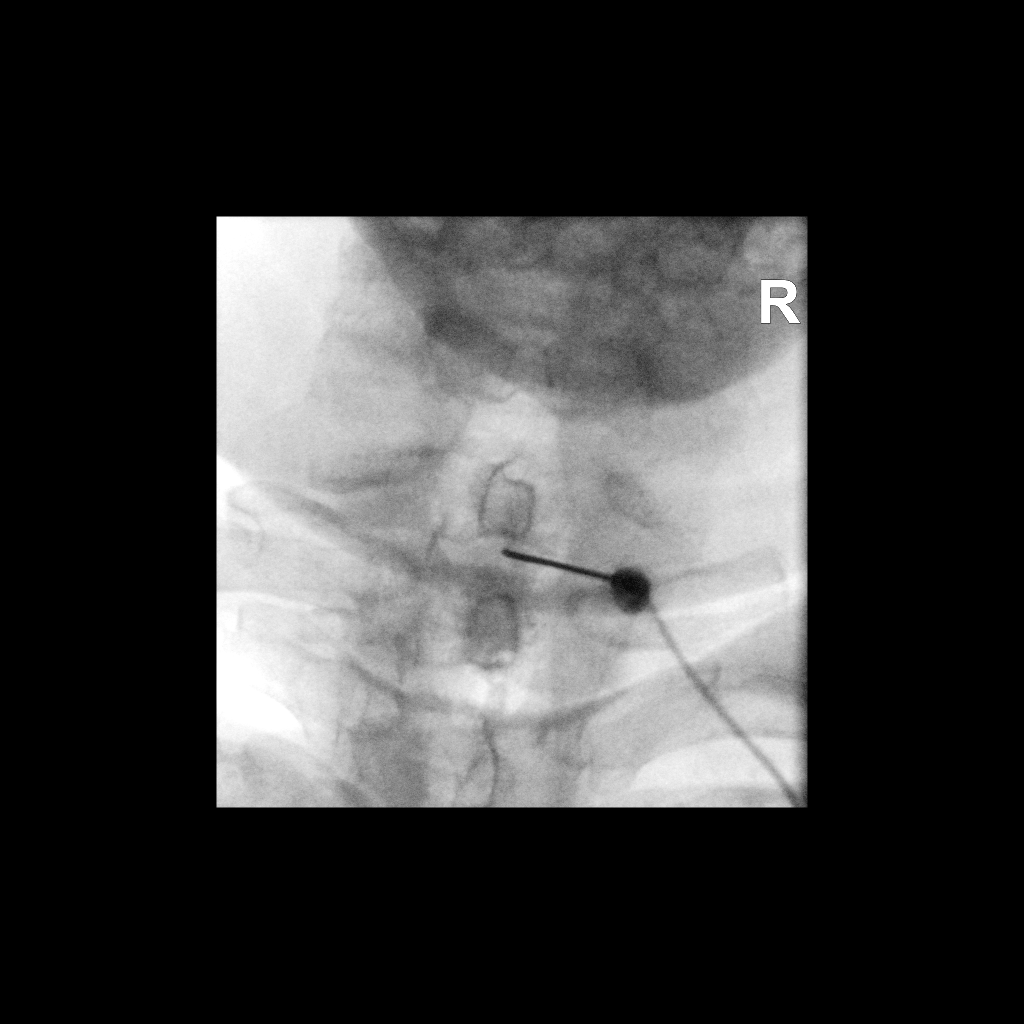

[2 of 2 positions shown; findings below may reference images not displayed]

FLUOROSCOPY TIME:  0 minutes 39 seconds. 34.39 micro gray meter
squared

PROCEDURE:
CERVICAL EPIDURAL INJECTION

An interlaminar approach was performed on the right at C7-T1 . A 20
gauge epidural needle was advanced using loss-of-resistance
technique.

DIAGNOSTIC EPIDURAL INJECTION

Injection of Isovue-M 300 shows a good epidural pattern with spread
above and below the level of needle placement, primarily on the
right. No vascular opacification is seen. THERAPEUTIC

EPIDURAL INJECTION

1.5 ml of Kenalog 40 mixed with 1 ml of 1% Lidocaine and 2 ml of
normal saline were then instilled. The procedure was well-tolerated,
and the patient was discharged thirty minutes following the
injection in good condition.
IMPRESSION: Technically successful initial epidural injection on the right at
C7-T1.

## 2018-02-28 DIAGNOSIS — I8312 Varicose veins of left lower extremity with inflammation: Secondary | ICD-10-CM | POA: Diagnosis not present

## 2018-02-28 DIAGNOSIS — I83812 Varicose veins of left lower extremities with pain: Secondary | ICD-10-CM | POA: Diagnosis not present

## 2018-04-10 DIAGNOSIS — E119 Type 2 diabetes mellitus without complications: Secondary | ICD-10-CM | POA: Diagnosis not present

## 2018-05-08 DIAGNOSIS — I8312 Varicose veins of left lower extremity with inflammation: Secondary | ICD-10-CM | POA: Diagnosis not present

## 2018-05-08 DIAGNOSIS — I83812 Varicose veins of left lower extremities with pain: Secondary | ICD-10-CM | POA: Diagnosis not present

## 2018-05-09 DIAGNOSIS — Z124 Encounter for screening for malignant neoplasm of cervix: Secondary | ICD-10-CM | POA: Diagnosis not present

## 2018-05-09 DIAGNOSIS — Z01411 Encounter for gynecological examination (general) (routine) with abnormal findings: Secondary | ICD-10-CM | POA: Diagnosis not present

## 2018-05-09 DIAGNOSIS — Z6832 Body mass index (BMI) 32.0-32.9, adult: Secondary | ICD-10-CM | POA: Diagnosis not present

## 2018-05-09 DIAGNOSIS — R399 Unspecified symptoms and signs involving the genitourinary system: Secondary | ICD-10-CM | POA: Diagnosis not present

## 2018-06-08 DIAGNOSIS — Z09 Encounter for follow-up examination after completed treatment for conditions other than malignant neoplasm: Secondary | ICD-10-CM | POA: Diagnosis not present

## 2018-06-08 DIAGNOSIS — N951 Menopausal and female climacteric states: Secondary | ICD-10-CM | POA: Diagnosis not present

## 2018-06-09 DIAGNOSIS — I8312 Varicose veins of left lower extremity with inflammation: Secondary | ICD-10-CM | POA: Diagnosis not present

## 2018-07-07 DIAGNOSIS — E785 Hyperlipidemia, unspecified: Secondary | ICD-10-CM | POA: Diagnosis not present

## 2018-07-07 DIAGNOSIS — E119 Type 2 diabetes mellitus without complications: Secondary | ICD-10-CM | POA: Diagnosis not present

## 2018-07-07 DIAGNOSIS — I10 Essential (primary) hypertension: Secondary | ICD-10-CM | POA: Diagnosis not present

## 2018-07-07 DIAGNOSIS — E559 Vitamin D deficiency, unspecified: Secondary | ICD-10-CM | POA: Diagnosis not present

## 2018-10-23 DIAGNOSIS — E119 Type 2 diabetes mellitus without complications: Secondary | ICD-10-CM | POA: Diagnosis not present

## 2018-10-23 DIAGNOSIS — E279 Disorder of adrenal gland, unspecified: Secondary | ICD-10-CM | POA: Diagnosis not present

## 2018-11-03 DIAGNOSIS — E119 Type 2 diabetes mellitus without complications: Secondary | ICD-10-CM | POA: Diagnosis not present

## 2018-11-07 DIAGNOSIS — E119 Type 2 diabetes mellitus without complications: Secondary | ICD-10-CM | POA: Diagnosis not present

## 2018-11-07 DIAGNOSIS — E279 Disorder of adrenal gland, unspecified: Secondary | ICD-10-CM | POA: Diagnosis not present

## 2019-01-29 DIAGNOSIS — E119 Type 2 diabetes mellitus without complications: Secondary | ICD-10-CM | POA: Diagnosis not present

## 2019-01-29 DIAGNOSIS — E279 Disorder of adrenal gland, unspecified: Secondary | ICD-10-CM | POA: Diagnosis not present

## 2019-01-29 DIAGNOSIS — E559 Vitamin D deficiency, unspecified: Secondary | ICD-10-CM | POA: Diagnosis not present

## 2020-01-11 ENCOUNTER — Other Ambulatory Visit: Payer: Self-pay | Admitting: Family Medicine

## 2020-03-19 ENCOUNTER — Other Ambulatory Visit: Payer: Self-pay | Admitting: Family Medicine

## 2020-03-19 DIAGNOSIS — N644 Mastodynia: Secondary | ICD-10-CM

## 2020-08-21 ENCOUNTER — Other Ambulatory Visit: Payer: Self-pay | Admitting: Family Medicine

## 2020-08-21 DIAGNOSIS — M21371 Foot drop, right foot: Secondary | ICD-10-CM

## 2020-08-26 ENCOUNTER — Other Ambulatory Visit: Payer: Self-pay

## 2020-08-26 ENCOUNTER — Ambulatory Visit
Admission: RE | Admit: 2020-08-26 | Discharge: 2020-08-26 | Disposition: A | Discharge status: Home / Self Care | Payer: 59 | Source: Ambulatory Visit | Attending: Family Medicine | Admitting: Family Medicine

## 2020-08-26 DIAGNOSIS — M21371 Foot drop, right foot: Secondary | ICD-10-CM

## 2020-08-27 ENCOUNTER — Encounter: Payer: Self-pay | Admitting: *Deleted

## 2020-08-29 ENCOUNTER — Ambulatory Visit
Admission: RE | Admit: 2020-08-29 | Discharge: 2020-08-29 | Disposition: A | Discharge status: Home / Self Care | Payer: 59 | Source: Ambulatory Visit | Attending: Family Medicine | Admitting: Family Medicine

## 2020-08-29 DIAGNOSIS — M21371 Foot drop, right foot: Secondary | ICD-10-CM

## 2020-09-02 ENCOUNTER — Encounter: Payer: Self-pay | Admitting: Diagnostic Neuroimaging

## 2020-09-02 ENCOUNTER — Ambulatory Visit: Payer: 59 | Admitting: Diagnostic Neuroimaging

## 2020-09-02 VITALS — BP 158/92 | HR 60 | Ht 67.0 in | Wt 174.0 lb

## 2020-09-02 DIAGNOSIS — M21371 Foot drop, right foot: Secondary | ICD-10-CM

## 2020-09-02 NOTE — Progress Notes (Signed)
[facility]  PATIENT: Nicole Houston DOB: 09-Oct-1960  REFERRING CLINICIAN: Maurice Small, MD HISTORY FROM: patient  REASON FOR VISIT: new consult    HISTORICAL  CHIEF COMPLAINT:  Chief Complaint  Patient presents with   New Patient (Initial Visit)    Foot drop; since November 15th. Happened out of the blue, no known injuries. She had her Moderna booster on November 5th and she states it started soon after. That's the only change she can think of.    Referral    Maurice Small, MD   Room 6    Here alone    HISTORY OF PRESENT ILLNESS:   59 year old female here for evaluation of right foot drop.  Symptoms started around November 10-15, 2021, gradual onset and progressive right lower extremity weakness.  She notices mainly problems with her right foot and toe movement pulling them up.  Patient had Moderna Covid vaccine booster on November 5.  She has had 15 pound weight loss over the past 1 year.  Patient does have habitual leg crossing of right over left leg.  Last A1c was 7.3.  Patient went to PCP and endocrinology for evaluation.  She had MRI of the lumbar spine which showed multilevel degenerative changes, foraminal and spinal stenosis.   REVIEW OF SYSTEMS: Full 14 system review of systems performed and negative with exception of: as per HPI.  ALLERGIES: Allergies  Allergen Reactions   Metronidazole Other (See Comments)   Other Other (See Comments)    Crestor, cipro-stomach pain   Valsartan-Hydrochlorothiazide Other (See Comments)    dizzy   Altace [Ramipril] Cough   Tenormin [Atenolol] Other (See Comments)    Feel bad    HOME MEDICATIONS: Outpatient Medications Prior to Visit  Medication Sig Dispense Refill   amLODipine (NORVASC) 10 MG tablet Take 10 mg by mouth daily.      empagliflozin (JARDIANCE) 25 MG TABS tablet Take 25 mg by mouth daily.     glimepiride (AMARYL) 2 MG tablet Take 4 mg by mouth daily.      Semaglutide (OZEMPIC, 1  MG/DOSE, [place]) Inject 1 mg into the skin once a week.     losartan-hydrochlorothiazide (HYZAAR) 100-25 MG per tablet Take 1 tablet by mouth daily.  (Patient not taking: Reported on 09/02/2020)     Probiotic Product (PROBIOTIC PO) Take 1 capsule by mouth daily. (Patient not taking: Reported on 09/02/2020)     No facility-administered medications prior to visit.    PAST MEDICAL HISTORY: Past Medical History:  Diagnosis Date   Adrenal mass ([place])    Aortic atherosclerosis ([place])    Chlamydia    Diabetes mellitus    Diverticulitis    Fibroid    Foot drop, right    Gall stones    Hyperlipemia    Hypertension    Measles    Murmur    Yeast vaginitis     PAST SURGICAL HISTORY: Past Surgical History:  Procedure Laterality Date   ENDOMETRIAL ABLATION     TUBAL LIGATION      FAMILY HISTORY: Family History  Problem Relation Age of Onset   Diabetes Father    Hypertension Father    Diabetes Mother    Hypertension Mother    Diabetes Sister    Stroke Sister     SOCIAL HISTORY: Social History   Socioeconomic History   Marital status: Married    Spouse name: Not on file   Number of children: 2   Years of education: Not on  file   Highest education level: Not on file  Occupational History   Not on file  Tobacco Use   Smoking status: Current Every Day Smoker    Packs/day: 0.50    Types: Cigarettes   Smokeless tobacco: Never Used  Vaping Use   Vaping Use: Former  Substance and Sexual Activity   Alcohol use: No   Drug use: No   Sexual activity: Yes    Partners: Male    Birth control/protection: None  Other Topics Concern   Not on file  Social History Narrative   Lives with husband   Right handed   Caffeine: 4 cups/day (2 in AM and 2 in afternoon)   Social Determinants of Health   Financial Resource Strain: Not on file  Food Insecurity: Not on file  Transportation Needs: Not on file  Physical Activity: Not on file  Stress: Not on  file  Social Connections: Not on file  Intimate Partner Violence: Not on file     PHYSICAL EXAM  GENERAL EXAM/CONSTITUTIONAL: Vitals:  Vitals:   09/02/20 0944 09/02/20 0947  BP: (!) 161/96 (!) 158/92  Pulse: (!) 59 60  Weight: 174 lb (78.9 kg)   Height: 5\' 7"  (1.702 m)      Body mass index is 27.25 kg/m. Wt Readings from Last 3 Encounters:  09/02/20 174 lb (78.9 kg)  11/07/16 194 lb (88 kg)  05/20/16 193 lb (87.5 kg)     Patient is in no distress; well developed, nourished and groomed; neck is supple  CARDIOVASCULAR:  Examination of carotid arteries is normal; no carotid bruits  Regular rate and rhythm, no murmurs  Examination of peripheral vascular system by observation and palpation is normal  EYES:  Ophthalmoscopic exam of optic discs and posterior segments is normal; no papilledema or hemorrhages  No exam data present  MUSCULOSKELETAL:  Gait, strength, tone, movements noted in Neurologic exam below  NEUROLOGIC: MENTAL STATUS:  No flowsheet data found.  awake, alert, oriented to person, place and time  recent and remote memory intact  normal attention and concentration  language fluent, comprehension intact, naming intact  fund of knowledge appropriate  CRANIAL NERVE:   2nd - no papilledema on fundoscopic exam  2nd, 3rd, 4th, 6th - pupils equal and reactive to light, visual fields full to confrontation, extraocular muscles intact, no nystagmus  5th - facial sensation symmetric  7th - facial strength symmetric  8th - hearing intact  9th - palate elevates symmetrically, uvula midline  11th - shoulder shrug symmetric  12th - tongue protrusion midline  MOTOR:   normal bulk and tone, full strength in the BUE, BLE; EXCEPT RIGHT FOOT DORSIFLEXION AND EVERSION (2/5); INVERSION 5/5  SENSORY:   normal and symmetric to light touch, pinprick, temperature, vibration  COORDINATION:   finger-nose-finger, fine finger movements  normal  REFLEXES:   deep tendon reflexes TRACE and symmetric  GAIT/STATION:   RIGHT FOOT STEPPAGE GAIT     DIAGNOSTIC DATA (LABS, IMAGING, TESTING) - I reviewed patient records, labs, notes, testing and imaging myself where available.  Lab Results  Component Value Date   WBC 8.6 11/07/2016   HGB 13.2 11/07/2016   HCT 40.0 11/07/2016   MCV 92.2 11/07/2016   PLT 341 11/07/2016      Component Value Date/Time   NA 139 11/07/2016 1016   K 4.3 11/07/2016 1016   CL 104 11/07/2016 1016   CO2 25 11/07/2016 1016   GLUCOSE 271 (H) 11/07/2016 1016   BUN 10  11/07/2016 1016   CREATININE 0.67 11/07/2016 1016   CALCIUM 11.5 (H) 11/07/2016 1016   PROT 7.2 11/17/2015 1855   ALBUMIN 4.0 11/17/2015 1855   AST 12 (L) 11/17/2015 1855   ALT 14 11/17/2015 1855   ALKPHOS 62 11/17/2015 1855   BILITOT 0.3 11/17/2015 1855   GFRNONAA >60 11/07/2016 1016   GFRAA >60 11/07/2016 1016   No results found for: CHOL, HDL, LDLCALC, LDLDIRECT, TRIG, CHOLHDL No results found for: HGBA1C No results found for: VITAMINB12 No results found for: TSH   08/26/20 MRI lumbar spine [I reviewed images myself and agree with interpretation. -VRP]  1. Multilevel lumbar disc and facet degeneration. 2. Mild-to-moderate spinal and lateral recess at L4-5. 3. Mild-to-moderate left lateral recess and left neural foraminal stenosis at L2-3.    ASSESSMENT AND PLAN  59 y.o. 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  Could be related to weight loss and habitual leg crossing, in the setting of diabetes.  We will proceed with EMG nerve conduction and PT evaluation.  Dx:  1. Right foot drop     PLAN:  RIGHT FOOT DROP (suspect right peroneal neuropathy) - PT evaluation [facility] orthopedic) - check EMG/NCS  Orders Placed This Encounter  Procedures   Ambulatory referral to Physical Therapy    NCV with EMG(electromyography)   Return for for NCV/EMG.    Penni Bombard, MD [id number], [id number] AM Certified in Neurology, Neurophysiology and Neuroimaging  [facility] [address] [place], [place] [address] [phone]

## 2020-09-02 NOTE — Patient Instructions (Signed)
RIGHT FOOT DROP (suspect right peroneal neuropathy) - PT evaluation [facility] orthopedic) - check EMG/NCS

## 2020-09-25 ENCOUNTER — Ambulatory Visit (INDEPENDENT_AMBULATORY_CARE_PROVIDER_SITE_OTHER): Payer: 59 | Admitting: Diagnostic Neuroimaging

## 2020-09-25 ENCOUNTER — Encounter: Payer: 59 | Admitting: Diagnostic Neuroimaging

## 2020-09-25 DIAGNOSIS — G5731 Lesion of lateral popliteal nerve, right lower limb: Secondary | ICD-10-CM | POA: Diagnosis not present

## 2020-09-25 DIAGNOSIS — Z0289 Encounter for other administrative examinations: Secondary | ICD-10-CM

## 2020-09-25 DIAGNOSIS — M21371 Foot drop, right foot: Secondary | ICD-10-CM

## 2020-09-25 NOTE — Procedures (Signed)
[facility]  NCS (NERVE CONDUCTION STUDY) WITH EMG (ELECTROMYOGRAPHY) REPORT   STUDY DATE: 09/25/20 PATIENT NAME: Nicole Houston DOB: September 10, 1961 MRN: [id number]  ORDERING CLINICIAN: Andrey Spearman, MD   TECHNOLOGIST: Sherre Scarlet ELECTROMYOGRAPHER: Earlean Polka. Manette Doto, MD  CLINICAL INFORMATION: 60 year old female with right foot drop.  FINDINGS: NERVE CONDUCTION STUDY:  Right peroneal motor response with recording over extensor digitorum brevis has normal distal latency, normal amplitude with stimulation at the ankle and fibular head, decreased amplitude with stimulation of popliteal fossa, normal conduction velocity.  Right tibial motor response with recording over the tibialis anterior shows decreased amplitude with stimulation at the popliteal fossa, normal distal latency and normal conduction velocity.  Left peroneal motor response is normal.  Right tibial motor response is normal.  Bilateral superficial peroneal and right sural sensory responses are normal.  Right tibial F-wave latency is normal.   NEEDLE ELECTROMYOGRAPHY:  Needle examination of the right lower extremity shows abnormal spontaneous activity in the right tibialis anterior and right peroneus longus muscles at rest, and decreased motor unit recruitment on exertion.  Right vastus medialis, gastrocnemius, tibialis posterior, gluteus medius and right lumbar paraspinal muscles are normal.    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    INTERPRETING PHYSICIAN:  Penni Bombard, MD Certified in Neurology, Neurophysiology and Neuroimaging  [facility] [address], [place], [place] [address] [phone]   [facility]    Nerve / Sites Muscle Latency Ref. Amplitude Ref. Rel Amp Segments Distance Velocity Ref. Area    ms ms mV mV %  cm m/s m/s mVms  R Peroneal -  EDB     Ankle EDB 3.8 ?6.5 3.3 ?2.0 100 Ankle - EDB 9   14.8     Fib head EDB 10.3  3.2  98.1 Fib head - Ankle 29 44 ?44 14.8     Pop fossa EDB 12.6  1.5  45.7 Pop fossa - Fib head 10 44 ?44 4.7         Pop fossa - Ankle      L Peroneal - EDB     Ankle EDB 3.7 ?6.5 5.8 ?2.0 100 Ankle - EDB 9   15.0     Fib head EDB 10.0  4.8  83.4 Fib head - Ankle 28 44 ?44 13.6     Pop fossa EDB 12.3  4.6  95.9 Pop fossa - Fib head 10 44 ?44 13.9         Pop fossa - Ankle      R Tibial - AH     Ankle AH 4.4 ?5.8 7.8 ?4.0 100 Ankle - AH 9   12.8     Pop fossa AH 14.1  4.6  59 Pop fossa - Ankle 42 43 ?41 9.4  R Peroneal - Tib Ant     Fib Head Tib Ant 5.4 ?6.7 3.5 ?3.0 100 Fib Head - Tib Ant 10   23.6     Pop fossa Tib Ant 3.8  0.9  25.2 Pop fossa - Fib Head 10 62 ?44 4.3             SNC    Nerve / Sites Rec. Site Peak Lat Ref.  Amp Ref. Segments Distance    ms ms V V  cm  R Sural - Ankle (Calf)     Calf Ankle 3.9 ?4.4 6 ?6 Calf - Ankle  14  R Superficial peroneal - Ankle     Lat leg Ankle 4.1 ?4.4 6 ?6 Lat leg - Ankle 14  L Superficial peroneal - Ankle     Lat leg Ankle 4.0 ?4.4 7 ?6 Lat leg - Ankle 14           F  Wave    Nerve F Lat Ref.   ms ms  R Tibial - AH 55.0 ?56.0       EMG Summary Table    Spontaneous MUAP Recruitment  Muscle IA Fib PSW Fasc Other Amp Dur. Poly Pattern  R. Vastus medialis Normal None None None _______ Normal Normal Normal Normal  R. Peroneus longus Normal 2+ 2+ None _______ Normal Normal Normal Reduced  R. Tibialis anterior Normal 2+ 2+ None _______ Increased Normal Normal Discrete  R. Tibialis posterior Normal None None None _______ Normal Normal Normal Normal  R. Gastrocnemius (Medial head) Normal None None None _______ Normal Normal Normal Normal  R. Biceps femoris (short head) Normal None None None _______ Normal Normal Normal Normal  R. Gluteus medius Normal None None None _______ Normal Normal Normal Normal  R. Lumbar paraspinals Normal None None None  _______ Normal Normal Normal Normal

## 2020-09-30 NOTE — Progress Notes (Signed)
  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  PLAN: - check MRI right knee  Orders Placed This Encounter  Procedures  . MR KNEE RIGHT [place]. Maven Rosander, MD 3/82/5053, 9:76 PM Certified in Neurology, Neurophysiology and [facility] [address] [place], [place] [address] [phone]

## 2020-10-01 ENCOUNTER — Telehealth: Payer: Self-pay | Admitting: Diagnostic Neuroimaging

## 2020-10-01 NOTE — Telephone Encounter (Signed)
[employer] pending  

## 2020-10-01 NOTE — Telephone Encounter (Signed)
[employer] Josem Kaufmann: [id number] (exp. 10/01/20 to 11/15/20) order sent to GI. They will reach out to the patient to schedule.

## 2020-10-07 ENCOUNTER — Ambulatory Visit
Admission: RE | Admit: 2020-10-07 | Discharge: 2020-10-07 | Disposition: A | Discharge status: Home / Self Care | Payer: 59 | Source: Ambulatory Visit | Attending: Diagnostic Neuroimaging | Admitting: Diagnostic Neuroimaging

## 2020-10-07 ENCOUNTER — Other Ambulatory Visit: Payer: Self-pay

## 2020-10-07 DIAGNOSIS — G5731 Lesion of lateral popliteal nerve, right lower limb: Secondary | ICD-10-CM

## 2020-10-07 DIAGNOSIS — M21371 Foot drop, right foot: Secondary | ICD-10-CM

## 2020-10-07 MED ORDER — GADOBENATE DIMEGLUMINE 529 MG/ML IV SOLN
15.0000 mL | Freq: Once | INTRAVENOUS | Status: AC | PRN
  Administered 2020-10-07: 15 mL via INTRAVENOUS

## 2020-10-14 ENCOUNTER — Telehealth: Payer: Self-pay | Admitting: Diagnostic Neuroimaging

## 2020-10-14 NOTE — Telephone Encounter (Signed)
No masses noted. Small cysts are noted, but not clear if causing symptoms. Next step could be to see orthopedic consult for further evaluation of these cysts. -VRP

## 2020-10-14 NOTE — Telephone Encounter (Signed)
LVM informing patient of Dr Gladstone Lighter result note and next steps. Requested she call back if she would like referral placed.

## 2020-10-14 NOTE — Telephone Encounter (Signed)
Pt called, had MRI on 10/07/20, have not heard from anyone. Would like a call from the nurse.

## 2020-11-10 ENCOUNTER — Telehealth: Payer: Self-pay | Admitting: *Deleted

## 2020-11-10 NOTE — Telephone Encounter (Signed)
spoke with patient and informed her MRI right knee showed no major issues. Small cysts are noted. She could consider orthopedic consult for further evaluation.  She stated she took copy of MRI to her PT and they are working on it. She has felt some improvement. She'll call back if PT doesn't help her and will request orthopedic referral . Patient verbalized understanding, appreciation.

## 2021-09-22 ENCOUNTER — Other Ambulatory Visit: Payer: Self-pay | Admitting: Internal Medicine

## 2021-09-22 ENCOUNTER — Other Ambulatory Visit (HOSPITAL_COMMUNITY): Payer: Self-pay | Admitting: Internal Medicine

## 2021-09-22 DIAGNOSIS — E213 Hyperparathyroidism, unspecified: Secondary | ICD-10-CM

## 2021-10-16 ENCOUNTER — Encounter (HOSPITAL_COMMUNITY): Payer: 59

## 2021-10-16 ENCOUNTER — Encounter (HOSPITAL_COMMUNITY): Payer: Self-pay
